# Patient Record
Sex: Female | Born: 1960 | Race: White | Hispanic: No | Marital: Married | Smoking: Former smoker
Health system: Southern US, Community
[De-identification: ages and names within clinical notes are randomized; demographics above are authoritative.]

## PROBLEM LIST (undated history)

## (undated) DIAGNOSIS — J189 Pneumonia, unspecified organism: Secondary | ICD-10-CM

## (undated) DIAGNOSIS — Z8489 Family history of other specified conditions: Secondary | ICD-10-CM

## (undated) DIAGNOSIS — E0789 Other specified disorders of thyroid: Secondary | ICD-10-CM

## (undated) DIAGNOSIS — C50919 Malignant neoplasm of unspecified site of unspecified female breast: Secondary | ICD-10-CM

## (undated) DIAGNOSIS — E119 Type 2 diabetes mellitus without complications: Secondary | ICD-10-CM

## (undated) DIAGNOSIS — Z923 Personal history of irradiation: Secondary | ICD-10-CM

## (undated) DIAGNOSIS — E039 Hypothyroidism, unspecified: Secondary | ICD-10-CM

## (undated) DIAGNOSIS — K219 Gastro-esophageal reflux disease without esophagitis: Secondary | ICD-10-CM

## (undated) HISTORY — PX: JOINT REPLACEMENT: SHX530

## (undated) HISTORY — PX: CATARACT EXTRACTION, BILATERAL: SHX1313

## (undated) HISTORY — PX: COLONOSCOPY: SHX174

---

## 2010-01-17 ENCOUNTER — Ambulatory Visit: Payer: Self-pay | Admitting: Unknown Physician Specialty

## 2010-11-22 ENCOUNTER — Ambulatory Visit: Payer: Self-pay | Admitting: Specialist

## 2010-11-25 LAB — PATHOLOGY REPORT

## 2011-05-05 ENCOUNTER — Emergency Department: Payer: Self-pay | Admitting: *Deleted

## 2011-05-05 LAB — COMPREHENSIVE METABOLIC PANEL
Alkaline Phosphatase: 108 U/L (ref 50–136)
Anion Gap: 12 (ref 7–16)
Bilirubin,Total: 0.3 mg/dL (ref 0.2–1.0)
Calcium, Total: 9.4 mg/dL (ref 8.5–10.1)
Chloride: 105 mmol/L (ref 98–107)
Co2: 24 mmol/L (ref 21–32)
Creatinine: 0.96 mg/dL (ref 0.60–1.30)
Potassium: 3.8 mmol/L (ref 3.5–5.1)
SGOT(AST): 24 U/L (ref 15–37)
SGPT (ALT): 34 U/L
Total Protein: 8 g/dL (ref 6.4–8.2)

## 2011-05-05 LAB — URINALYSIS, COMPLETE
Bilirubin,UR: NEGATIVE
Ketone: NEGATIVE
Ph: 5 (ref 4.5–8.0)
RBC,UR: 5 /HPF (ref 0–5)
Squamous Epithelial: 2
WBC UR: 7 /HPF (ref 0–5)

## 2011-05-05 LAB — LIPASE, BLOOD: Lipase: 161 U/L (ref 73–393)

## 2011-05-05 LAB — CBC
HCT: 42.2 % (ref 35.0–47.0)
HGB: 14.1 g/dL (ref 12.0–16.0)
MCHC: 33.4 g/dL (ref 32.0–36.0)
MCV: 92 fL (ref 80–100)
RBC: 4.58 10*6/uL (ref 3.80–5.20)
RDW: 11.7 % (ref 11.5–14.5)
WBC: 8.7 10*3/uL (ref 3.6–11.0)

## 2014-03-10 DIAGNOSIS — J189 Pneumonia, unspecified organism: Secondary | ICD-10-CM

## 2014-03-10 HISTORY — DX: Pneumonia, unspecified organism: J18.9

## 2014-07-02 NOTE — Consult Note (Signed)
PATIENT NAME:  Denise Saunders, Denise Saunders MR#:  [id number] DATE OF BIRTH:  [birth year]  DATE OF CONSULTATION:  05/05/2011  REFERRING PHYSICIAN:   CONSULTING PHYSICIAN:  Consuela Mimes, MD  HISTORY OF PRESENT ILLNESS: Denise Saunders is a 54 year old white female who developed periumbilical abdominal pain yesterday at 4 p.m. This moved to her right lower quadrant and has persisted. She has not had a fever. She is not nauseous. She does not have any anorexia, vomiting, or diarrhea. She specifically denies all genitourinary symptoms including dysuria, frequency, urgency, and back pain.   PAST MEDICAL HISTORY: No medical illnesses.   PAST SURGICAL HISTORY: Wrist surgery for ganglion cyst only.   MEDICATIONS:  1. Benadryl. 2. Tylenol PM.   ALLERGIES: None.   LABORATORY, DIAGNOSTIC, AND RADIOLOGICAL DATA: Evaluation in the Emergency Room revealed a normal white blood cell count of 8000, normal hematocrit and platelet count. Normal electrolytes. Normal liver tests. Urinalysis showed 1+ blood, 1+ leukocyte esterase, 7 white blood cells per high-power field, and mucous. The patient has not had a period in 13 years. CT scan of the abdomen was essentially normal from a gastrointestinal standpoint including a normal appendix. There was a 41 mm left adnexal cyst and fatty liver as the only abnormal findings. Ultrasound of the pelvis including transvaginal exam confirmed the 44 mm left adnexal cyst with no fluid in the pelvis and no torsion of either ovary. According to the electronic health record, the patient had a normal ultrasound of the gallbladder in 2011 and in November of 2011 had a gallbladder ejection fraction that was normal at 76% as well. The CT scan that was performed was done at 7:30 a.m. which was 15-1/2 hours after the onset of her symptoms.   REVIEW OF SYSTEMS: Negative for 10 systems except as mentioned in the history of present illness. Specifically, the patient denies nausea, vomiting, diarrhea,  fever, and genitourinary symptoms.   FAMILY HISTORY: Noncontributory.   SOCIAL HISTORY: The patient is married and lives at home with her husband and her 17 year old son. She works in Programmer, applications at [employer]. She smokes approximately one-quarter pack of cigarettes per day and does not drink alcohol and has not done so for decades.   PHYSICAL EXAMINATION:   GENERAL: Middle-aged white female with mild to moderate obesity who is comfortable lying in the Emergency Room. Height 5 feet 6 inches, weight 158 pounds, BMI 25.5.   VITAL SIGNS: Temperature 97.6, pulse 115, respirations 20, blood pressure 122/87, oxygen saturation 97% on room air.   HEENT: Pupils equally round and reactive to light. Extraocular movements intact. Sclerae are nonicteric. Oropharynx clear. Mucous membranes moist.   NECK: Supple with no lymphadenopathy, jugular venous distention, or shift in the trachea away from its midline position.   HEART: Regular rate and rhythm with no murmurs or rubs.   LUNGS: Clear to auscultation with normal respiratory effort bilaterally.   ABDOMEN: Soft, nontender, nondistended. The patient does have some voluntary guarding anywhere she is touched at the umbilicus or below but there is no involuntary guarding. If she has any tenderness at all, it is worse in the suprapubic region and the right lower quadrant.   EXTREMITIES: No edema with normal capillary refill bilaterally.   NEUROLOGIC: Cranial nerves II through XII, motor and sensation grossly intact.   PSYCHIATRIC: Alert and oriented x4. Appropriate affect.   ASSESSMENT: Abdominal pain with very low probability of acute appendicitis or gallbladder pathology or other gastrointestinal pathology. Negative CT scan greater than  six hours after onset of symptoms had a very high negative predictive value in this patient with essentially normal physical examination and normal white count and no fever. I do not believe she has acute  appendicitis. She may have a very early urinary tract infection or her left adnexal cyst may be symptomatic even though it does not appear to have ruptured. No surgical admission is indicated and no surgical follow-up is indicated either.   ____________________________ Consuela Mimes, MD wfm:drc D: 05/05/2011 12:32:58 ET T: 05/05/2011 12:44:31 ET JOB#: [id number]  cc: Consuela Mimes, MD, <Dictator> Consuela Mimes MD ELECTRONICALLY SIGNED 05/08/2011 9:45

## 2015-06-15 ENCOUNTER — Ambulatory Visit (INDEPENDENT_AMBULATORY_CARE_PROVIDER_SITE_OTHER): Payer: BC Managed Care – PPO

## 2015-06-15 ENCOUNTER — Ambulatory Visit
Admission: EM | Admit: 2015-06-15 | Discharge: 2015-06-15 | Disposition: A | Discharge status: Home / Self Care | Payer: BC Managed Care – PPO | Attending: Family Medicine | Admitting: Family Medicine

## 2015-06-15 ENCOUNTER — Encounter: Payer: Self-pay | Admitting: *Deleted

## 2015-06-15 DIAGNOSIS — J9801 Acute bronchospasm: Secondary | ICD-10-CM

## 2015-06-15 DIAGNOSIS — J189 Pneumonia, unspecified organism: Secondary | ICD-10-CM | POA: Diagnosis not present

## 2015-06-15 LAB — RAPID STREP SCREEN (MED CTR MEBANE ONLY): Streptococcus, Group A Screen (Direct): NEGATIVE

## 2015-06-15 LAB — RAPID INFLUENZA A&B ANTIGENS (ARMC ONLY)
INFLUENZA A (ARMC): NEGATIVE
INFLUENZA B (ARMC): NEGATIVE

## 2015-06-15 MED ORDER — PREDNISONE 20 MG PO TABS: ORAL_TABLET | ORAL | Status: DC

## 2015-06-15 MED ORDER — IPRATROPIUM-ALBUTEROL 0.5-2.5 (3) MG/3ML IN SOLN
3.0000 mL | Freq: Once | RESPIRATORY_TRACT | Status: AC
  Administered 2015-06-15: 3 mL via RESPIRATORY_TRACT

## 2015-06-15 MED ORDER — HYDROCOD POLST-CPM POLST ER 10-8 MG/5ML PO SUER: 5.0000 mL | Freq: Two times a day (BID) | ORAL | Status: DC | PRN

## 2015-06-15 MED ORDER — ALBUTEROL SULFATE HFA 108 (90 BASE) MCG/ACT IN AERS: 1.0000 | INHALATION_SPRAY | RESPIRATORY_TRACT | Status: DC | PRN

## 2015-06-15 MED ORDER — LEVOFLOXACIN 500 MG PO TABS: 500.0000 mg | ORAL_TABLET | Freq: Every day | ORAL | Status: DC

## 2015-06-15 NOTE — ED Provider Notes (Signed)
CSN: [id number]     Arrival date & time 06/15/15  1300 History   First MD Initiated Contact with Patient 06/15/15 1444     Chief Complaint  Patient presents with  . Cough  . Shortness of Breath  . Generalized Body Aches  . Headache  . Pleurisy   (Consider location/radiation/quality/duration/timing/severity/associated sxs/prior Treatment) Patient is a 55 y.o. female presenting with URI. The history is provided by the patient.  URI Presenting symptoms: congestion, cough, fatigue and fever   Severity:  Moderate Onset quality:  Sudden Duration:  1 week Timing:  Constant Progression:  Worsening Chronicity:  New Relieved by:  Nothing Ineffective treatments:  OTC medications Associated symptoms: headaches and wheezing   Risk factors: not elderly, no chronic cardiac disease, no chronic kidney disease, no immunosuppression, no recent illness and no recent travel  Chronic respiratory disease: unknown; former smoker      History reviewed. No pertinent past medical history. Past Surgical History  Procedure Laterality Date  . Cesarean section     History reviewed. No pertinent family history. Social History  Substance Use Topics  . Smoking status: Former Research scientist (life sciences)  . Smokeless tobacco: None  . Alcohol Use: No   OB History    No data available     Review of Systems  Constitutional: Positive for fever and fatigue.  HENT: Positive for congestion.   Respiratory: Positive for cough and wheezing.   Neurological: Positive for headaches.    Allergies  Review of patient's allergies indicates no known allergies.  Home Medications   Prior to Admission medications   Medication Sig Start Date End Date Taking? Authorizing Provider  albuterol (PROVENTIL HFA;VENTOLIN HFA) 108 (90 Base) MCG/ACT inhaler Inhale 1-2 puffs into the lungs every 4 (four) hours as needed for wheezing or shortness of breath. 06/15/15   Norval Gable, MD  chlorpheniramine-HYDROcodone (TUSSIONEX PENNKINETIC ER) 10-8  MG/5ML SUER Take 5 mLs by mouth every 12 (twelve) hours as needed. 06/15/15   Norval Gable, MD  levofloxacin (LEVAQUIN) 500 MG tablet Take 1 tablet (500 mg total) by mouth daily. 06/15/15   Norval Gable, MD  predniSONE (DELTASONE) 20 MG tablet 3 tabs po once day 1, then 2 tabs po qd for 4 days, then 1 tab po qd for 4 days, then half a tab po qd for 3 days 06/15/15   Norval Gable, MD   Meds Ordered and Administered this Visit   Medications  ipratropium-albuterol (DUONEB) 0.5-2.5 (3) MG/3ML nebulizer solution 3 mL (3 mLs Nebulization Given 06/15/15 1456)  ipratropium-albuterol (DUONEB) 0.5-2.5 (3) MG/3ML nebulizer solution 3 mL (3 mLs Nebulization Given 06/15/15 1532)    BP 104/76 mmHg  Pulse 122  Temp(Src) 99.9 F (37.7 C) (Oral)  Resp 20  Ht 5\' 6"  (1.676 m)  Wt 154 lb (69.854 kg)  BMI 24.87 kg/m2  SpO2 90% No data found.  recheck O2 sat= 92% RA    Physical Exam  Constitutional: She appears well-developed and well-nourished. No distress.  HENT:  Head: Normocephalic and atraumatic.  Right Ear: Tympanic membrane, external ear and ear canal normal.  Left Ear: Tympanic membrane, external ear and ear canal normal.  Nose: No nose lacerations, sinus tenderness, nasal deformity, septal deviation or nasal septal hematoma. No epistaxis.  No foreign bodies.  Mouth/Throat: Uvula is midline, oropharynx is clear and moist and mucous membranes are normal. No oropharyngeal exudate.  Eyes: Conjunctivae and EOM are normal. Pupils are equal, round, and reactive to light. Right eye exhibits no discharge. Left eye exhibits  no discharge. No scleral icterus.  Neck: Normal range of motion. Neck supple. No thyromegaly present.  Cardiovascular: Normal rate, regular rhythm and normal heart sounds.   Pulmonary/Chest: Effort normal. No respiratory distress. She has wheezes (mild to moderate wheezes bilaterally). She has rales (bibasilar right > left).  Lymphadenopathy:    She has no cervical adenopathy.  Skin:  She is not diaphoretic.  Nursing note and vitals reviewed.   ED Course  Procedures (including critical care time)  Labs Review Labs Reviewed  RAPID INFLUENZA A&B ANTIGENS ([facility] ONLY)  RAPID STREP SCREEN (NOT AT [facility])  CULTURE, GROUP A STREP [facility])    Imaging Review Dg Chest 2 View  06/15/2015  CLINICAL DATA:  Cough with fever and chills EXAM: CHEST  2 VIEW COMPARISON:  None. FINDINGS: Bronchitic markings without lobar pneumonia or edema. Especially in the lateral projection there are somewhat streaky areas of reticular nodular density. No effusion or air leak. Normal heart size and mediastinal contours. IMPRESSION: Bronchitis with possible bronchopneumonia. Electronically Signed   By: Monte Fantasia M.D.   On: 06/15/2015 15:15     Visual Acuity Review  Right Eye Distance:   Left Eye Distance:   Bilateral Distance:    Right Eye Near:   Left Eye Near:    Bilateral Near:         MDM   1. Community acquired pneumonia   2. Bronchospasm    New Prescriptions   ALBUTEROL (PROVENTIL HFA;VENTOLIN HFA) 108 (90 BASE) MCG/ACT INHALER    Inhale 1-2 puffs into the lungs every 4 (four) hours as needed for wheezing or shortness of breath.   CHLORPHENIRAMINE-HYDROCODONE (TUSSIONEX PENNKINETIC ER) 10-8 MG/5ML SUER    Take 5 mLs by mouth every 12 (twelve) hours as needed.   LEVOFLOXACIN (LEVAQUIN) 500 MG TABLET    Take 1 tablet (500 mg total) by mouth daily.   PREDNISONE (DELTASONE) 20 MG TABLET    3 tabs po once day 1, then 2 tabs po qd for 4 days, then 1 tab po qd for 4 days, then half a tab po qd for 3 days    1. Labs/x-ray results and diagnosis reviewed with patient 2. Patient given duoneb treatment x 2 with improvement of symptoms and signs; O2 sat=94-95% prior to D/C 3. rx as per orders above; reviewed possible side effects, interactions, risks and benefits  3. Recommend supportive treatment with rest, increased fluids 4. Follow-up up prn; to ED if symptoms worsen   Norval Gable, MD 06/15/15 [phone]

## 2015-06-15 NOTE — ED Notes (Signed)
Productive cough-yellow, dyspnea, body aches, headache, chest pain/soreness with coughing /movement. Recent hx of pneumonia.

## 2015-06-17 LAB — CULTURE, GROUP A STREP (THRC)

## 2016-06-13 ENCOUNTER — Other Ambulatory Visit: Payer: Self-pay | Admitting: Otolaryngology

## 2016-06-13 ENCOUNTER — Other Ambulatory Visit: Payer: Self-pay | Admitting: Medical Genetics

## 2016-06-13 DIAGNOSIS — E041 Nontoxic single thyroid nodule: Secondary | ICD-10-CM

## 2016-06-17 ENCOUNTER — Ambulatory Visit
Admission: RE | Admit: 2016-06-17 | Discharge: 2016-06-17 | Disposition: A | Discharge status: Home / Self Care | Payer: BC Managed Care – PPO | Source: Ambulatory Visit | Attending: Medical Genetics | Admitting: Medical Genetics

## 2016-06-17 DIAGNOSIS — E041 Nontoxic single thyroid nodule: Secondary | ICD-10-CM | POA: Diagnosis present

## 2016-06-19 ENCOUNTER — Other Ambulatory Visit: Payer: Self-pay | Admitting: Otolaryngology

## 2016-06-19 DIAGNOSIS — E041 Nontoxic single thyroid nodule: Secondary | ICD-10-CM

## 2016-06-27 ENCOUNTER — Ambulatory Visit
Admission: RE | Admit: 2016-06-27 | Discharge: 2016-06-27 | Disposition: A | Discharge status: Home / Self Care | Payer: BC Managed Care – PPO | Source: Ambulatory Visit | Attending: Otolaryngology | Admitting: Otolaryngology

## 2016-06-27 ENCOUNTER — Other Ambulatory Visit: Payer: Self-pay | Admitting: Otolaryngology

## 2016-06-27 DIAGNOSIS — E041 Nontoxic single thyroid nodule: Secondary | ICD-10-CM | POA: Insufficient documentation

## 2016-06-27 NOTE — Procedures (Signed)
Pre Procedure Dx: Indeterminate thyroid nodule Post Procedural Dx: Same  Technically successful US guided biopsy of indeterminate 2.1 cm nodule within the right lobe of the thyroid (#4).  Technically successful US guided biopsy of indeterminate 1.8 cm nodule within the left lobe of the thyroid (#2).  EBL: Trace  No immediate complications.   Ronny Bacon, MD Pager #: [phone]

## 2016-06-27 NOTE — Discharge Instructions (Signed)
Thyroid Biopsy, Care After Refer to this sheet in the next few weeks. These instructions provide you with information on caring for yourself after your procedure. Your health care provider may also give you more specific instructions. Your treatment has been planned according to current medical practices, but problems sometimes occur. Call your health care provider if you have any problems or questions after your procedure. What can I expect after the procedure? After your procedure, it is typical to have the following:  You may have soreness and tenderness at the biopsy site for a few days.  You are encouraged to use ice pack to neck hourly for about 5 minutes. Any discomfort should go away after a couple days. Follow these instructions at home:  Take medicines only as directed by your health care provider.  To ease discomfort at the biopsy site:  Keep your head raised on a pillow when you are lying down.  Support the back of your head and neck with both hands as you sit up from a lying position.  If you have a sore throat, try using throat lozenges or gargling with warm salt water.  Keep all follow-up visits as directed by your health care provider. This is important. Contact a health care provider if:  You have a fever. Get help right away if:  You have severe bleeding from the biopsy site.  You have difficulty swallowing.  You have drainage, redness, swelling, or pain at the biopsy site.  You have swollen glands (lymph nodes) in your neck. This information is not intended to replace advice given to you by your health care provider. Make sure you discuss any questions you have with your health care provider. Document Released: 09/21/2013 Document Revised: 10/28/2015 Document Reviewed: 05/19/2013 Elsevier Interactive Patient Education  2017 [employer].

## 2016-07-01 LAB — CYTOLOGY - NON PAP

## 2016-07-08 ENCOUNTER — Other Ambulatory Visit: Payer: Self-pay | Admitting: Otolaryngology

## 2016-07-08 DIAGNOSIS — E041 Nontoxic single thyroid nodule: Secondary | ICD-10-CM

## 2016-07-08 DIAGNOSIS — E0590 Thyrotoxicosis, unspecified without thyrotoxic crisis or storm: Secondary | ICD-10-CM

## 2016-08-07 ENCOUNTER — Encounter
Admission: RE | Admit: 2016-08-07 | Discharge: 2016-08-07 | Disposition: A | Discharge status: Home / Self Care | Payer: BC Managed Care – PPO | Source: Ambulatory Visit | Attending: Otolaryngology | Admitting: Otolaryngology

## 2016-08-07 DIAGNOSIS — E0520 Thyrotoxicosis with toxic multinodular goiter without thyrotoxic crisis or storm: Secondary | ICD-10-CM | POA: Insufficient documentation

## 2016-08-07 DIAGNOSIS — E0590 Thyrotoxicosis, unspecified without thyrotoxic crisis or storm: Secondary | ICD-10-CM

## 2016-08-07 MED ORDER — SODIUM IODIDE I-123 7.4 MBQ CAPS
158.5000 | ORAL_CAPSULE | Freq: Once | ORAL | Status: AC
  Administered 2016-08-07: 158.5 via ORAL

## 2016-08-08 ENCOUNTER — Encounter
Admission: RE | Admit: 2016-08-08 | Discharge: 2016-08-08 | Disposition: A | Discharge status: Home / Self Care | Payer: BC Managed Care – PPO | Source: Ambulatory Visit | Attending: Otolaryngology | Admitting: Otolaryngology

## 2016-09-15 ENCOUNTER — Encounter
Admission: RE | Admit: 2016-09-15 | Discharge: 2016-09-15 | Disposition: A | Discharge status: Home / Self Care | Payer: BC Managed Care – PPO | Source: Ambulatory Visit | Attending: Otolaryngology | Admitting: Otolaryngology

## 2016-09-15 DIAGNOSIS — Z01818 Encounter for other preprocedural examination: Secondary | ICD-10-CM | POA: Insufficient documentation

## 2016-09-15 DIAGNOSIS — E119 Type 2 diabetes mellitus without complications: Secondary | ICD-10-CM | POA: Insufficient documentation

## 2016-09-15 HISTORY — DX: Pneumonia, unspecified organism: J18.9

## 2016-09-15 HISTORY — DX: Gastro-esophageal reflux disease without esophagitis: K21.9

## 2016-09-15 HISTORY — DX: Type 2 diabetes mellitus without complications: E11.9

## 2016-09-15 HISTORY — DX: Family history of other specified conditions: Z84.89

## 2016-09-15 NOTE — Pre-Procedure Instructions (Signed)
SPOKE WITH DR Ronelle Nigh REGARDING HGB A1C OF 10.2 BACK IN MAY 2018.  PT HAVING THYROIDECTOMY ON 09-22-16. PT COMING IN FOR RECHECK OF METB PER DR JUENGELS ORDER.  DR Ronelle Nigh INFORMED THAT PT WAS STARTED ON GLIPIZIDE BACK IN MAY TO HELP TO BETTER CONTROL DIABETES.  DR Ronelle Nigh DID NOT WANT CLEARANCE SINCE NEW ORAL MED WAS STARTED RECENTLY BY PCP (PT DOES NOT SEE ENDOCRINOLOGIST).

## 2016-09-15 NOTE — Patient Instructions (Signed)
  Your procedure is scheduled on: 09-22-16 MONDAY Report to Same Day Surgery 2nd floor medical mall [facility] Entrance-take elevator on left to 2nd floor.  Check in with surgery information desk.) To find out your arrival time please call [phone] between 1PM - 3PM on 09-19-16 FRIDAY  Remember: Instructions that are not followed completely may result in serious medical risk, up to and including death, or upon the discretion of your surgeon and anesthesiologist your surgery may need to be rescheduled.    _x___ 1. Do not eat food or drink liquids after midnight. No gum chewing or hard candies.     __x__ 2. No Alcohol for 24 hours before or after surgery.   __x__3. No Smoking for 24 prior to surgery.   ____  4. Bring all medications with you on the day of surgery if instructed.    __x__ 5. Notify your doctor if there is any change in your medical condition     (cold, fever, infections).     Do not wear jewelry, make-up, hairpins, clips or nail polish.  Do not wear lotions, powders, or perfumes. You may wear deodorant.  Do not shave 48 hours prior to surgery. Men may shave face and neck.  Do not bring valuables to the hospital.    [facility] is not responsible for any belongings or valuables.               Contacts, dentures or bridgework may not be worn into surgery.  Leave your suitcase in the car. After surgery it may be brought to your room.  For patients admitted to the hospital, discharge time is determined by your treatment team.   Patients discharged the day of surgery will not be allowed to drive home.  You will need someone to drive you home and stay with you the night of your procedure.    Please read over the following fact sheets that you were given:     _x___ [place] WITH A SMALL SIP OF WATER. These include:  1. PRILOSEC  2. TAKE A PRILOSEC Sunday NIGHT BEFORE BED  3.  4.  5.  6.  ____Fleets enema or Magnesium  Citrate as directed.   _x___ Use CHG Soap or sage wipes as directed on instruction sheet   ____ Use inhalers on the day of surgery and bring to hospital day of surgery  _X___ Stop Metformin and Janumet 2 days prior to surgery-LAST [place], July 13TH    ____ Take 1/2 of usual insulin dose the night before surgery and none on the morning surgery.   ____ Follow recommendations from Cardiologist, Pulmonologist or PCP regarding  stopping Aspirin, Coumadin, Pllavix ,Eliquis, Effient, or Pradaxa, and Pletal.  X____Stop Anti-inflammatories such as Advil, Aleve, Ibuprofen, Motrin, Naproxen, Naprosyn, Goodies powders or aspirin products NOW-OK to take Tylenol    ____ Stop supplements until after surgery.     ____ Bring C-Pap to the hospital.

## 2016-09-17 ENCOUNTER — Encounter
Admission: RE | Admit: 2016-09-17 | Discharge: 2016-09-17 | Disposition: A | Discharge status: Home / Self Care | Payer: BC Managed Care – PPO | Source: Ambulatory Visit | Attending: Otolaryngology | Admitting: Otolaryngology

## 2016-09-17 DIAGNOSIS — Z01818 Encounter for other preprocedural examination: Secondary | ICD-10-CM | POA: Diagnosis present

## 2016-09-17 DIAGNOSIS — E119 Type 2 diabetes mellitus without complications: Secondary | ICD-10-CM | POA: Diagnosis not present

## 2016-09-17 LAB — BASIC METABOLIC PANEL
ANION GAP: 11 (ref 5–15)
BUN: 14 mg/dL (ref 6–20)
CALCIUM: 9.6 mg/dL (ref 8.9–10.3)
CO2: 28 mmol/L (ref 22–32)
CREATININE: 0.7 mg/dL (ref 0.44–1.00)
Chloride: 102 mmol/L (ref 101–111)
Glucose, Bld: 179 mg/dL — ABNORMAL HIGH (ref 65–99)
Potassium: 4.4 mmol/L (ref 3.5–5.1)
SODIUM: 141 mmol/L (ref 135–145)

## 2016-09-17 LAB — HEPATIC FUNCTION PANEL
ALT: 27 U/L (ref 14–54)
AST: 19 U/L (ref 15–41)
Albumin: 4.3 g/dL (ref 3.5–5.0)
Alkaline Phosphatase: 113 U/L (ref 38–126)
BILIRUBIN TOTAL: 1.1 mg/dL (ref 0.3–1.2)
Total Protein: 7.4 g/dL (ref 6.5–8.1)

## 2016-09-18 NOTE — Pre-Procedure Instructions (Signed)
CALLED DR Marcello Moores REGARDING EKG THAT SHOWED SINUS TACH AT 107-DR Hospers IS FINE AND OK TO PROCEED WITH SURGERY

## 2016-09-22 ENCOUNTER — Encounter
Admission: RE | Disposition: A | Discharge status: Home / Self Care | Payer: Self-pay | Source: Ambulatory Visit | Attending: Otolaryngology

## 2016-09-22 ENCOUNTER — Observation Stay
Admission: RE | Admit: 2016-09-22 | Discharge: 2016-09-23 | Disposition: A | Discharge status: Home / Self Care | Payer: BC Managed Care – PPO | Source: Ambulatory Visit | Attending: Otolaryngology | Admitting: Otolaryngology

## 2016-09-22 ENCOUNTER — Encounter: Payer: Self-pay | Admitting: *Deleted

## 2016-09-22 ENCOUNTER — Ambulatory Visit: Payer: BC Managed Care – PPO | Admitting: Anesthesiology

## 2016-09-22 DIAGNOSIS — E0789 Other specified disorders of thyroid: Secondary | ICD-10-CM | POA: Diagnosis present

## 2016-09-22 DIAGNOSIS — Z79899 Other long term (current) drug therapy: Secondary | ICD-10-CM | POA: Insufficient documentation

## 2016-09-22 DIAGNOSIS — E042 Nontoxic multinodular goiter: Secondary | ICD-10-CM | POA: Diagnosis present

## 2016-09-22 DIAGNOSIS — E119 Type 2 diabetes mellitus without complications: Secondary | ICD-10-CM | POA: Diagnosis not present

## 2016-09-22 DIAGNOSIS — K219 Gastro-esophageal reflux disease without esophagitis: Secondary | ICD-10-CM | POA: Diagnosis not present

## 2016-09-22 DIAGNOSIS — Z7984 Long term (current) use of oral hypoglycemic drugs: Secondary | ICD-10-CM | POA: Insufficient documentation

## 2016-09-22 DIAGNOSIS — E0520 Thyrotoxicosis with toxic multinodular goiter without thyrotoxic crisis or storm: Secondary | ICD-10-CM | POA: Diagnosis not present

## 2016-09-22 DIAGNOSIS — F17200 Nicotine dependence, unspecified, uncomplicated: Secondary | ICD-10-CM | POA: Insufficient documentation

## 2016-09-22 HISTORY — PX: THYROIDECTOMY: SHX17

## 2016-09-22 LAB — GLUCOSE, CAPILLARY
GLUCOSE-CAPILLARY: 240 mg/dL — AB (ref 65–99)
GLUCOSE-CAPILLARY: 383 mg/dL — AB (ref 65–99)
Glucose-Capillary: 222 mg/dL — ABNORMAL HIGH (ref 65–99)
Glucose-Capillary: 228 mg/dL — ABNORMAL HIGH (ref 65–99)

## 2016-09-22 SURGERY — THYROIDECTOMY
Anesthesia: General | Wound class: Clean

## 2016-09-22 MED ORDER — ACETAMINOPHEN 10 MG/ML IV SOLN
INTRAVENOUS | Status: AC
  Filled 2016-09-22: qty 100

## 2016-09-22 MED ORDER — FENTANYL CITRATE (PF) 100 MCG/2ML IJ SOLN
INTRAMUSCULAR | Status: AC
  Filled 2016-09-22: qty 2

## 2016-09-22 MED ORDER — LIDOCAINE HCL (PF) 2 % IJ SOLN
INTRAMUSCULAR | Status: AC
  Filled 2016-09-22: qty 2

## 2016-09-22 MED ORDER — ACETAMINOPHEN 325 MG PO TABS: 650.0000 mg | ORAL_TABLET | Freq: Four times a day (QID) | ORAL | Status: DC | PRN

## 2016-09-22 MED ORDER — INSULIN ASPART 100 UNIT/ML ~~LOC~~ SOLN
3.0000 [IU] | Freq: Once | SUBCUTANEOUS | Status: AC
  Administered 2016-09-22: 3 [IU] via SUBCUTANEOUS

## 2016-09-22 MED ORDER — ONDANSETRON HCL 4 MG/2ML IJ SOLN: 4.0000 mg | Freq: Once | INTRAMUSCULAR | Status: DC | PRN

## 2016-09-22 MED ORDER — LIDOCAINE HCL (CARDIAC) 20 MG/ML IV SOLN
INTRAVENOUS | Status: DC | PRN
  Administered 2016-09-22: 100 mg via INTRAVENOUS

## 2016-09-22 MED ORDER — ACETAMINOPHEN 650 MG RE SUPP: 650.0000 mg | Freq: Four times a day (QID) | RECTAL | Status: DC | PRN

## 2016-09-22 MED ORDER — PANTOPRAZOLE SODIUM 40 MG PO TBEC: 40.0000 mg | DELAYED_RELEASE_TABLET | Freq: Every day | ORAL | Status: DC | PRN

## 2016-09-22 MED ORDER — PROPOFOL 10 MG/ML IV BOLUS
INTRAVENOUS | Status: AC
  Filled 2016-09-22: qty 40

## 2016-09-22 MED ORDER — DEXAMETHASONE SODIUM PHOSPHATE 10 MG/ML IJ SOLN
INTRAMUSCULAR | Status: DC | PRN
  Administered 2016-09-22: 10 mg via INTRAVENOUS

## 2016-09-22 MED ORDER — INSULIN ASPART 100 UNIT/ML ~~LOC~~ SOLN
SUBCUTANEOUS | Status: AC
  Administered 2016-09-22: 3 [IU] via SUBCUTANEOUS
  Filled 2016-09-22: qty 1

## 2016-09-22 MED ORDER — PHENYLEPHRINE HCL 10 MG/ML IJ SOLN
INTRAMUSCULAR | Status: AC
  Filled 2016-09-22: qty 1

## 2016-09-22 MED ORDER — GLYCOPYRROLATE 0.2 MG/ML IJ SOLN
INTRAMUSCULAR | Status: DC | PRN
  Administered 2016-09-22: 0.2 mg via INTRAVENOUS

## 2016-09-22 MED ORDER — INSULIN ASPART 100 UNIT/ML ~~LOC~~ SOLN
0.0000 [IU] | Freq: Every day | SUBCUTANEOUS | Status: DC
  Administered 2016-09-22: 2 [IU] via SUBCUTANEOUS
  Filled 2016-09-22: qty 1

## 2016-09-22 MED ORDER — PHENYLEPHRINE HCL 10 MG/ML IJ SOLN
INTRAMUSCULAR | Status: DC | PRN
  Administered 2016-09-22: 100 ug via INTRAVENOUS
  Administered 2016-09-22: 200 ug via INTRAVENOUS
  Administered 2016-09-22 (×2): 100 ug via INTRAVENOUS
  Administered 2016-09-22: 200 ug via INTRAVENOUS

## 2016-09-22 MED ORDER — SODIUM CHLORIDE 0.9 % IV SOLN
INTRAVENOUS | Status: DC
  Administered 2016-09-22 (×2): via INTRAVENOUS

## 2016-09-22 MED ORDER — FLEET ENEMA 7-19 GM/118ML RE ENEM: 1.0000 | ENEMA | Freq: Once | RECTAL | Status: DC | PRN

## 2016-09-22 MED ORDER — SEVOFLURANE IN SOLN
RESPIRATORY_TRACT | Status: AC
  Filled 2016-09-22: qty 250

## 2016-09-22 MED ORDER — METFORMIN HCL 500 MG PO TABS
1000.0000 mg | ORAL_TABLET | Freq: Two times a day (BID) | ORAL | Status: DC
  Administered 2016-09-22: 1000 mg via ORAL
  Filled 2016-09-22 (×3): qty 2

## 2016-09-22 MED ORDER — SODIUM CHLORIDE 0.9 % IV SOLN
INTRAVENOUS | Status: DC | PRN
  Administered 2016-09-22: 60 ug/min via INTRAVENOUS

## 2016-09-22 MED ORDER — GLIPIZIDE 10 MG PO TABS
10.0000 mg | ORAL_TABLET | Freq: Every evening | ORAL | Status: DC
  Administered 2016-09-22: 10 mg via ORAL
  Filled 2016-09-22: qty 1

## 2016-09-22 MED ORDER — GLYCOPYRROLATE 0.2 MG/ML IJ SOLN
INTRAMUSCULAR | Status: AC
  Filled 2016-09-22: qty 1

## 2016-09-22 MED ORDER — ONDANSETRON HCL 4 MG/2ML IJ SOLN
INTRAMUSCULAR | Status: AC
  Filled 2016-09-22: qty 2

## 2016-09-22 MED ORDER — INSULIN ASPART 100 UNIT/ML ~~LOC~~ SOLN: 0.0000 [IU] | Freq: Three times a day (TID) | SUBCUTANEOUS | Status: DC

## 2016-09-22 MED ORDER — BACITRACIN ZINC 500 UNIT/GM EX OINT
TOPICAL_OINTMENT | CUTANEOUS | Status: AC
  Filled 2016-09-22: qty 28.35

## 2016-09-22 MED ORDER — LIDOCAINE-EPINEPHRINE (PF) 1 %-1:200000 IJ SOLN
INTRAMUSCULAR | Status: DC | PRN
  Administered 2016-09-22: 5 mL

## 2016-09-22 MED ORDER — DEXTROSE-NACL 5-0.45 % IV SOLN
INTRAVENOUS | Status: DC
  Administered 2016-09-22 – 2016-09-23 (×2): via INTRAVENOUS

## 2016-09-22 MED ORDER — SUCCINYLCHOLINE CHLORIDE 20 MG/ML IJ SOLN
INTRAMUSCULAR | Status: DC | PRN
  Administered 2016-09-22: 100 mg via INTRAVENOUS

## 2016-09-22 MED ORDER — FENTANYL CITRATE (PF) 100 MCG/2ML IJ SOLN
25.0000 ug | INTRAMUSCULAR | Status: DC | PRN
  Administered 2016-09-22 (×4): 25 ug via INTRAVENOUS

## 2016-09-22 MED ORDER — BISACODYL 5 MG PO TBEC: 5.0000 mg | DELAYED_RELEASE_TABLET | Freq: Every day | ORAL | Status: DC | PRN

## 2016-09-22 MED ORDER — ONDANSETRON 4 MG PO TBDP: 4.0000 mg | ORAL_TABLET | Freq: Four times a day (QID) | ORAL | Status: DC | PRN

## 2016-09-22 MED ORDER — METOPROLOL TARTRATE 5 MG/5ML IV SOLN
INTRAVENOUS | Status: DC | PRN
  Administered 2016-09-22: 2 mg via INTRAVENOUS
  Administered 2016-09-22: 1 mg via INTRAVENOUS

## 2016-09-22 MED ORDER — MORPHINE SULFATE (PF) 4 MG/ML IV SOLN
3.0000 mg | INTRAVENOUS | Status: DC | PRN
  Administered 2016-09-22 (×2): 3 mg via INTRAVENOUS
  Filled 2016-09-22 (×2): qty 1

## 2016-09-22 MED ORDER — DEXAMETHASONE SODIUM PHOSPHATE 10 MG/ML IJ SOLN
INTRAMUSCULAR | Status: AC
  Filled 2016-09-22: qty 1

## 2016-09-22 MED ORDER — EPHEDRINE SULFATE 50 MG/ML IJ SOLN
INTRAMUSCULAR | Status: AC
  Filled 2016-09-22: qty 1

## 2016-09-22 MED ORDER — MAGNESIUM HYDROXIDE 400 MG/5ML PO SUSP
30.0000 mL | Freq: Every day | ORAL | Status: DC | PRN
  Filled 2016-09-22: qty 30

## 2016-09-22 MED ORDER — HYDROCODONE-ACETAMINOPHEN 5-325 MG PO TABS
1.0000 | ORAL_TABLET | ORAL | Status: DC | PRN
  Administered 2016-09-22: 2 via ORAL
  Administered 2016-09-22 – 2016-09-23 (×2): 1 via ORAL
  Filled 2016-09-22: qty 2
  Filled 2016-09-22 (×2): qty 1

## 2016-09-22 MED ORDER — GABAPENTIN 300 MG PO CAPS
300.0000 mg | ORAL_CAPSULE | Freq: Two times a day (BID) | ORAL | Status: DC
  Filled 2016-09-22: qty 1

## 2016-09-22 MED ORDER — VENLAFAXINE HCL ER 75 MG PO CP24
150.0000 mg | ORAL_CAPSULE | Freq: Every day | ORAL | Status: DC
  Filled 2016-09-22: qty 2

## 2016-09-22 MED ORDER — FENTANYL CITRATE (PF) 100 MCG/2ML IJ SOLN
INTRAMUSCULAR | Status: DC | PRN
  Administered 2016-09-22 (×4): 25 ug via INTRAVENOUS
  Administered 2016-09-22: 100 ug via INTRAVENOUS

## 2016-09-22 MED ORDER — ONDANSETRON HCL 4 MG/2ML IJ SOLN: 4.0000 mg | Freq: Four times a day (QID) | INTRAMUSCULAR | Status: DC | PRN

## 2016-09-22 MED ORDER — DIPHENHYDRAMINE HCL 25 MG PO CAPS
25.0000 mg | ORAL_CAPSULE | ORAL | Status: DC | PRN
  Administered 2016-09-22 – 2016-09-23 (×2): 25 mg via ORAL
  Filled 2016-09-22 (×2): qty 1

## 2016-09-22 MED ORDER — ONDANSETRON HCL 4 MG/2ML IJ SOLN
INTRAMUSCULAR | Status: DC | PRN
  Administered 2016-09-22: 4 mg via INTRAVENOUS

## 2016-09-22 MED ORDER — OMEPRAZOLE MAGNESIUM 20 MG PO TBEC: 20.0000 mg | DELAYED_RELEASE_TABLET | ORAL | Status: DC | PRN

## 2016-09-22 MED ORDER — PROPOFOL 10 MG/ML IV BOLUS
INTRAVENOUS | Status: DC | PRN
  Administered 2016-09-22: 150 mg via INTRAVENOUS
  Administered 2016-09-22: 100 mg via INTRAVENOUS

## 2016-09-22 MED ORDER — METOPROLOL TARTRATE 5 MG/5ML IV SOLN
3.0000 mg | Freq: Once | INTRAVENOUS | Status: AC
  Administered 2016-09-22: 3 mg via INTRAVENOUS

## 2016-09-22 MED ORDER — METOPROLOL TARTRATE 5 MG/5ML IV SOLN
INTRAVENOUS | Status: AC
  Filled 2016-09-22: qty 5

## 2016-09-22 MED ORDER — METOPROLOL TARTRATE 5 MG/5ML IV SOLN
INTRAVENOUS | Status: AC
  Administered 2016-09-22: 3 mg via INTRAVENOUS
  Filled 2016-09-22: qty 5

## 2016-09-22 MED ORDER — FENTANYL CITRATE (PF) 100 MCG/2ML IJ SOLN
INTRAMUSCULAR | Status: AC
  Administered 2016-09-22: 25 ug via INTRAVENOUS
  Filled 2016-09-22: qty 2

## 2016-09-22 MED ORDER — MIDAZOLAM HCL 2 MG/2ML IJ SOLN
INTRAMUSCULAR | Status: DC | PRN
  Administered 2016-09-22: 2 mg via INTRAVENOUS

## 2016-09-22 MED ORDER — SUCCINYLCHOLINE CHLORIDE 20 MG/ML IJ SOLN
INTRAMUSCULAR | Status: AC
  Filled 2016-09-22: qty 1

## 2016-09-22 MED ORDER — LIDOCAINE-EPINEPHRINE (PF) 1 %-1:200000 IJ SOLN
INTRAMUSCULAR | Status: AC
  Filled 2016-09-22: qty 30

## 2016-09-22 MED ORDER — MIDAZOLAM HCL 2 MG/2ML IJ SOLN
INTRAMUSCULAR | Status: AC
  Filled 2016-09-22: qty 2

## 2016-09-22 MED ORDER — ACETAMINOPHEN 10 MG/ML IV SOLN
INTRAVENOUS | Status: DC | PRN
  Administered 2016-09-22: 1000 mg via INTRAVENOUS

## 2016-09-22 SURGICAL SUPPLY — 39 items
BLADE SURG 15 STRL LF DISP TIS (BLADE) ×1 IMPLANT
BLADE SURG 15 STRL SS (BLADE) ×2
CANISTER SUCT 1200ML W/VALVE (MISCELLANEOUS) ×3 IMPLANT
CORD BIP STRL DISP 12FT (MISCELLANEOUS) ×3 IMPLANT
DRAIN TLS ROUND 10FR (DRAIN) IMPLANT
DRAPE MAG INST 16X20 L/F (DRAPES) ×3 IMPLANT
DRSG TEGADERM 2-3/8X2-3/4 SM (GAUZE/BANDAGES/DRESSINGS) ×3 IMPLANT
DRSG TEGADERM 4X4.75 (GAUZE/BANDAGES/DRESSINGS) ×3 IMPLANT
DRSG TELFA 3X8 NADH (GAUZE/BANDAGES/DRESSINGS) IMPLANT
ELECT LARYNGEAL 6/7 (MISCELLANEOUS) ×3
ELECT LARYNGEAL 8/9 (MISCELLANEOUS)
ELECT REM PT RETURN 9FT ADLT (ELECTROSURGICAL) ×3
ELECTRODE LARYNGEAL 6/7 (MISCELLANEOUS) ×1 IMPLANT
ELECTRODE LARYNGEAL 8/9 (MISCELLANEOUS) IMPLANT
ELECTRODE REM PT RTRN 9FT ADLT (ELECTROSURGICAL) ×1 IMPLANT
FORCEPS JEWEL BIP 4-3/4 STR (INSTRUMENTS) ×3 IMPLANT
GLOVE BIO SURGEON STRL SZ7.5 (GLOVE) ×6 IMPLANT
GLOVE BIOGEL PI IND STRL 6.5 (GLOVE) ×1 IMPLANT
GLOVE BIOGEL PI INDICATOR 6.5 (GLOVE) ×2
GLOVE PROTEXIS LATEX SZ 7.5 (GLOVE) ×3 IMPLANT
GOWN STRL REUS W/ TWL LRG LVL3 (GOWN DISPOSABLE) ×3 IMPLANT
GOWN STRL REUS W/TWL LRG LVL3 (GOWN DISPOSABLE) ×6
HEMOSTAT SURGICEL 2X3 (HEMOSTASIS) ×3 IMPLANT
HOOK STAY BLUNT/RETRACTOR 5M (MISCELLANEOUS) ×3 IMPLANT
LABEL OR SOLS (LABEL) ×3 IMPLANT
NS IRRIG 500ML POUR BTL (IV SOLUTION) ×3 IMPLANT
PACK HEAD/NECK (MISCELLANEOUS) ×3 IMPLANT
PROBE NEUROSIGN BIPOL (MISCELLANEOUS) ×1 IMPLANT
PROBE NEUROSIGN BIPOLAR (MISCELLANEOUS) ×2
SHEARS HARMONIC 9CM CVD (BLADE) ×3 IMPLANT
SPONGE KITTNER 5P (MISCELLANEOUS) ×3 IMPLANT
SPONGE XRAY 4X4 16PLY STRL (MISCELLANEOUS) ×6 IMPLANT
STRAP SAFETY BODY (MISCELLANEOUS) ×3 IMPLANT
SUT ETHILON 6 0 9-3 1X18 BLK (SUTURE) IMPLANT
SUT PROLENE 6 0 P 1 18 (SUTURE) ×3 IMPLANT
SUT SILK 2 0 (SUTURE) ×2
SUT SILK 2-0 18XBRD TIE 12 (SUTURE) ×1 IMPLANT
SUT VIC AB 4-0 RB1 18 (SUTURE) ×3 IMPLANT
SYSTEM CHEST DRAIN TLS 7FR (DRAIN) ×6 IMPLANT

## 2016-09-22 NOTE — Anesthesia Post-op Follow-up Note (Cosign Needed)
Anesthesia QCDR form completed.        

## 2016-09-22 NOTE — Anesthesia Preprocedure Evaluation (Signed)
Anesthesia Evaluation  Patient identified by MRN, date of birth, ID band Patient awake    Reviewed: Allergy & Precautions, H&P , NPO status , Patient's Chart, lab work & pertinent test results, reviewed documented beta blocker date and time   Airway Mallampati: II  TM Distance: >3 FB Neck ROM: full    Dental  (+) Teeth Intact   Pulmonary neg pulmonary ROS, pneumonia, resolved, Current Smoker,    Pulmonary exam normal        Cardiovascular Exercise Tolerance: Good negative cardio ROS Normal cardiovascular exam Rhythm:regular Rate:Normal     Neuro/Psych negative neurological ROS  negative psych ROS   GI/Hepatic negative GI ROS, Neg liver ROS, GERD  Medicated,  Endo/Other  negative endocrine ROSdiabetes, Poorly Controlled, Type 2, Oral Hypoglycemic Agents  Renal/GU negative Renal ROS  negative genitourinary   Musculoskeletal   Abdominal   Peds  Hematology negative hematology ROS (+)   Anesthesia Other Findings Past Medical History: No date: Diabetes mellitus without complication (HCC) No date: Family history of adverse reaction to anesthesia     Comment:  MOM-N/V AND IS HARD TO WAKE No date: GERD (gastroesophageal reflux disease)     Comment:  [place] 2016: Pneumonia Past Surgical History: No date: CESAREAN SECTION   Reproductive/Obstetrics negative OB ROS                             Anesthesia Physical Anesthesia Plan  ASA: III  Anesthesia Plan: General ETT   Post-op Pain Management:    Induction:   PONV Risk Score and Plan: 3 and Ondansetron, Dexamethasone, Propofol and Midazolam  Airway Management Planned:   Additional Equipment:   Intra-op Plan:   Post-operative Plan:   Informed Consent: I have reviewed the patients History and Physical, chart, labs and discussed the procedure including the risks, benefits and alternatives for the proposed anesthesia with the patient or  authorized representative who has indicated his/her understanding and acceptance.   Dental Advisory Given  Plan Discussed with: CRNA  Anesthesia Plan Comments:         Anesthesia Quick Evaluation

## 2016-09-22 NOTE — OR Nursing (Signed)
Two 7 french TLS drains placed in neck. Right lateral neck and left lateral neck.

## 2016-09-22 NOTE — Progress Notes (Signed)
Denise Saunders 09/22/2016 6:30 PM  S: Patient is feeling quite well and is eating okay. She is had a little bit of a headache possibly from the morphine. She is little scratching her throat but is not complaining of other severe pain.  O: She is afebrile. Her vital signs still show her pulse rate to be a little bit elevated. Her blood pressure is relatively low. Her neck wound is flat and there is no blood staining on the dressing at all. Her drains are intact and changed recently. She does not have any signs of hypocalcemia.  A: She is doing very well postoperatively and is very stable. We'll plan to use Vicodin for pain orally. We'll continue to change drain Vacutainer's every 4 hours.  P: Lab work scheduled for early a.m. to get a calcium blood level. We will plan to change dressings and remove drains in the morning if the wound continues look good. We'll plan to discharge home in the morning if she continues to do well. We'll check fingersticks to make sure her blood sugar is not elevated.

## 2016-09-22 NOTE — Progress Notes (Signed)
Patient's BG elevated.  Patient had initially refused metformin and glipizide however was agreeable to take it at this time.  Administered and entered orders for SS coverage per MD order.

## 2016-09-22 NOTE — Anesthesia Postprocedure Evaluation (Signed)
Anesthesia Post Note  Patient: Denise Saunders  Procedure(s) Performed: Procedure(s) (LRB): THYROIDECTOMY (N/A)  Patient location during evaluation: PACU Anesthesia Type: General Level of consciousness: awake and alert Pain management: pain level controlled Vital Signs Assessment: post-procedure vital signs reviewed and stable Respiratory status: spontaneous breathing, nonlabored ventilation, respiratory function stable and patient connected to nasal cannula oxygen Cardiovascular status: blood pressure returned to baseline and stable Postop Assessment: no signs of nausea or vomiting Anesthetic complications: no     Last Vitals:  Vitals:   09/22/16 1153 09/22/16 1213  BP: 104/71 116/70  Pulse: (!) 101 98  Resp: 14 16  Temp:  36.8 C    Last Pain:  Vitals:   09/22/16 1227  TempSrc:   PainSc: [place]

## 2016-09-22 NOTE — H&P (Signed)
H&P has been reviewedand patient reevaluated,  and no changes necessary. To be downloaded later.  

## 2016-09-22 NOTE — Transfer of Care (Signed)
Immediate Anesthesia Transfer of Care Note  Patient: Denise Saunders  Procedure(s) Performed: Procedure(s): THYROIDECTOMY (N/A)  Patient Location: PACU  Anesthesia Type:General  Level of Consciousness: sedated  Airway & Oxygen Therapy: Patient Spontanous Breathing and Patient connected to face mask oxygen  Post-op Assessment: Report given to RN and Post -op Vital signs reviewed and stable  Post vital signs: Reviewed and stable  Last Vitals:  Vitals:   09/22/16 0616 09/22/16 1023  BP: 114/76 111/67  Pulse: (!) 112 (!) 103  Resp: 18 14  Temp: 37.5 C 69.4 C    Complications: No apparent anesthesia complications

## 2016-09-22 NOTE — Progress Notes (Signed)
Inpatient Diabetes Program Recommendations  AACE/ADA: New Consensus Statement on Inpatient Glycemic Control (2015)  Target Ranges:  Prepandial:   less than 140 mg/dL      Peak postprandial:   less than 180 mg/dL (1-2 hours)      Critically ill patients:  140 - 180 mg/dL  Results for Denise Saunders, Denise Saunders (MRN [id number]) as of 09/22/2016 12:16  Ref. Range 09/22/2016 06:17 09/22/2016 10:27  Glucose-Capillary Latest Ref Range: 65 - 99 mg/dL 222 (H) 240 (H)    Review of Glycemic Control  Diabetes history: DM2 Outpatient Diabetes medications: Glipizide 10 mg QPM, Metformin 1000 mg BID Current orders for Inpatient glycemic control: Glipizide 10 mg QPM, Metformin 1000 mg BID   Inpatient Diabetes Program Recommendations: Correction (SSI): While inpatient, please consider ordering CBGs with Novolog correction scale ACHS.  Thanks, Barnie Alderman, RN, MSN, CDE Diabetes Coordinator Inpatient Diabetes Program [phone] (Team Pager from 8am to 5pm)

## 2016-09-22 NOTE — Op Note (Signed)
09/22/2016  10:24 AM    Denise Saunders  [id number]   Pre-Op Dx:  Hyperthyroidism, multinodular goiter with hot nodules  Post-op Dx: Same  Proc: Total thyroidectomy   Surg:  Huey Romans   assistant: Carloyn Manner  Anes:  GOT  EBL:  100 mL  Comp:  None  Findings:  Very large multinodular goiter with edema. Both recurrent laryngeal nerves were found and stimulated well at the end of the case. Both parathyroids are found on the left side but were not found on the right side.  Procedure: The patient was brought to the operating room and placed in supine position. General anesthesia was obtained by oral endotracheal intubation. An electrode was placed on the endotracheal tube to place between the vocal cords for stimulation of the nerve and monitoring the recurrent laryngeal nerves through the case. This was done under direct vision by the anesthesia group. A shoulder roll was placed. The neck was marked at a skin crease near the suprasternal notch. 5 mL of 1% Xylocaine with epi 1:100,000 was used for infiltration of the skin for vasoconstriction. She was prepped and draped sterile fashion.  An incision was created the following the previously marked skin crease and was carried through skin and subcutaneous tissue. There was minimal bleeding and this was controlled electrocautery. The platysma layer was opened and there were very large anterior jugular veins. These were dissected and cut across and tied superiorly and inferiorly. The strap muscles were divided in the midline and the thyroid could be seen. It is fairly watery and edematous. Dissection was carried first on the left side around the lateral border of the thyroid gland. The strap muscles were elevated off of this. Dissection was carried superiorly and the superior thyroid vessels were dissected out and cut with the Harmonic scalpel. A superior parathyroid gland was evident here. The superior gland had several large nodules and  it was very difficult to get around it but eventually was completely freed up. Dissection was then carried more inferiorly and some the inferior thyroid vessels were cut across with the Harmonic scalpel as well. The inferior parathyroid gland was found here and freed up from the gland and left intact as well. The gland was rotated medially and the recurrent laryngeal nerve was found and stimulated well. Dissection was carried along the nerve to the junction with the larynx and the gland was freed above this. Berry's ligament was cut across to free this from the superior trachea and remaining inferior tracheal attachments were freed up.  The strap muscles were then elevated over the right side and the lateral attachments of the thyroid gland were freed up. Dissection was carried then superiorly. There is large nodule superiorly that were freed and the superior laryngeal vessels were cut across with the Harmonic scalpel. The superior laryngeal nerve was noted on the right side and left intact. The superior pole was pulled down and the gland was rotated laterally. Some the inferior vessels were then cut across and the gland was rotated even more medially. The recurrent laryngeal nerve was found lying the groove and traced superiorly. It was freed up from of Berry's ligament. There appeared to be possibly some parathyroid tissue superiorly but couldn't tell for sure. The inferior attachments to the trachea were then freed up. There was some pyramidal lobe came up over the thyroid notch. This was all freed up and removed with the gland. The gland was then tagged in the right superior pole was sent to  pathology as a whole specimen in formalin.  The peritracheal areas were then revisualized and were copiously irrigated. There is no significant bleeding on either side. The recurrent laryngeal nerves were evident on both sides and stimulated again with good movement. The parathyroid glands were elevated on the left side  superiorly and inferiorly but not evident on the right side. Surgicel was placed into the bed on both sides followed by a 7 TLS drain that crisscrossed in the midline.  For closure was done first by tagging the strap muscles together loosely and then closing platysmal layer with 40 Vicryls. The dermal layer was then closed with 4-0 Vicryl as well.  the skin edges were held in apposition using 6-0 nylon in a running locking suture. The wound was covered with little bacitracin followed by Telfa and a Tegaderm dressing. The drains are placed to low continuous Vacutainer suction. She was awakened and taken to the recovery room in satisfactory condition there were no operative complications  Dispo:   To PACU to then be sent to the floor to be observed overnight.  Plan:  We will watch drains and plan to remove in the morning if drainage is stopped. The drains were changed every 4 hours. She'll elevator head and will start a liquid diet advanced as tolerated. We'll check calcium level first thing in the morning to make sure her calcium levels are normal.  Denise Saunders H  09/22/2016 10:24 AM

## 2016-09-22 NOTE — Anesthesia Procedure Notes (Addendum)
Procedure Name: Intubation Date/Time: 09/22/2016 7:28 AM Performed by: Doreen Salvage Pre-anesthesia Checklist: Patient identified, Patient being monitored, Timeout performed, Emergency Drugs available and Suction available Patient Re-evaluated:Patient Re-evaluated prior to induction Oxygen Delivery Method: Circle system utilized Preoxygenation: Pre-oxygenation with 100% oxygen Induction Type: IV induction Ventilation: Mask ventilation without difficulty and Oral airway inserted - appropriate to patient size Laryngoscope Size: Mac and 3 Grade View: Grade I Tube type: Oral Tube size: 7.0 mm Number of attempts: 1 Airway Equipment and Method: Stylet Placement Confirmation: ETT inserted through vocal cords under direct vision,  positive ETCO2 and breath sounds checked- equal and bilateral Secured at: 21 cm Tube secured with: Tape Dental Injury: Teeth and Oropharynx as per pre-operative assessment

## 2016-09-23 DIAGNOSIS — E0520 Thyrotoxicosis with toxic multinodular goiter without thyrotoxic crisis or storm: Secondary | ICD-10-CM | POA: Diagnosis not present

## 2016-09-23 LAB — GLUCOSE, CAPILLARY: GLUCOSE-CAPILLARY: 143 mg/dL — AB (ref 65–99)

## 2016-09-23 LAB — CALCIUM: CALCIUM: 8.6 mg/dL — AB (ref 8.9–10.3)

## 2016-09-23 NOTE — Discharge Summary (Signed)
Physician Discharge Summary  Patient ID: Denise Saunders MRN: [id number] DOB/AGE: [birth year] 56 y.o.  Admit date: 09/22/2016 Discharge date: 09/23/2016  Admission Diagnoses:Multinodular goiter with hyperthyroidism  Discharge Diagnoses: Same Active Problems:   Thyroid mass of unclear etiology   Discharged Condition: good  Hospital Course: The patient had total thyroidectomy done yesterday morning. Her postop course has been unremarkable. Pain controlled well. Drains remove this morning and no swelling or bruising at the wound site. Her voice is clear and she is breathing easily. She coughed up some greenish stuff this morning and had a scratchy throat from prior to surgery. She thinks she may have caught something from her husband who is been sick. No fever noted here in the hospital. To be discharged this morning and follow up in the office next Monday for suture removal.  Consults: None  Significant Diagnostic Studies: labs: Calcium 8.6 this morning  Treatments: surgery: Total thyroidectomy  Discharge Exam: Blood pressure (!) 100/56, pulse 91, temperature 98 F (36.7 C), temperature source Oral, resp. rate 16, height 5\' 6"  (1.676 m), weight 73.5 kg (162 lb 1.6 oz), SpO2 95 %. Neck wound is flat and drains are removed. No leakage. Her voice is clear.  Disposition: 01-Home or Self Care  Discharge Instructions    Call MD for:  hives    Complete by:  As directed    Call MD for:  persistant nausea and vomiting    Complete by:  As directed    Call MD for:  redness, tenderness, or signs of infection (pain, swelling, redness, odor or green/yellow discharge around incision site)    Complete by:  As directed    Call MD for:  temperature >100.4    Complete by:  As directed    Diet general    Complete by:  As directed    Increase activity slowly    Complete by:  As directed      Allergies as of 09/23/2016   No Known Allergies     Medication List    TAKE these medications    atorvastatin 40 MG tablet Commonly known as:  LIPITOR Take 40 mg by mouth every evening.   estradiol 0.1 MG/GM vaginal cream Commonly known as:  ESTRACE Place 1 Applicatorful vaginally 2 (two) times a week.   FARXIGA 10 MG Tabs tablet Generic drug:  dapagliflozin propanediol Take 10 mg by mouth daily.   gabapentin 300 MG capsule Commonly known as:  NEURONTIN Take 300 mg by mouth 2 (two) times daily. lUNCH AND bEDTIME   glipiZIDE 10 MG tablet Commonly known as:  GLUCOTROL Take 10 mg by mouth every evening.   metFORMIN 1000 MG tablet Commonly known as:  GLUCOPHAGE Take 1,000 mg by mouth 2 (two) times daily with a meal.   omeprazole 20 MG tablet Commonly known as:  PRILOSEC OTC Take 20 mg by mouth as needed.   venlafaxine XR 150 MG 24 hr capsule Commonly known as:  EFFEXOR-XR Take 150 mg by mouth at bedtime.        Signed: Taneka Espiritu H 09/23/2016, 7:45 AM

## 2016-09-24 LAB — SURGICAL PATHOLOGY

## 2018-05-02 ENCOUNTER — Ambulatory Visit (INDEPENDENT_AMBULATORY_CARE_PROVIDER_SITE_OTHER): Payer: BLUE CROSS/BLUE SHIELD

## 2018-05-02 ENCOUNTER — Encounter: Payer: Self-pay | Admitting: Gynecology

## 2018-05-02 ENCOUNTER — Ambulatory Visit
Admission: EM | Admit: 2018-05-02 | Discharge: 2018-05-02 | Disposition: A | Discharge status: Home / Self Care | Payer: BLUE CROSS/BLUE SHIELD

## 2018-05-02 ENCOUNTER — Other Ambulatory Visit: Payer: Self-pay

## 2018-05-02 DIAGNOSIS — J209 Acute bronchitis, unspecified: Secondary | ICD-10-CM

## 2018-05-02 DIAGNOSIS — R05 Cough: Secondary | ICD-10-CM

## 2018-05-02 DIAGNOSIS — R059 Cough, unspecified: Secondary | ICD-10-CM

## 2018-05-02 MED ORDER — AZITHROMYCIN 250 MG PO TABS: 250.0000 mg | ORAL_TABLET | Freq: Every day | ORAL | 0 refills | Status: DC

## 2018-05-02 MED ORDER — GUAIFENESIN-CODEINE 100-10 MG/5ML PO SYRP: ORAL_SOLUTION | ORAL | 0 refills | Status: DC

## 2018-05-02 MED ORDER — ALBUTEROL SULFATE HFA 108 (90 BASE) MCG/ACT IN AERS: 1.0000 | INHALATION_SPRAY | Freq: Four times a day (QID) | RESPIRATORY_TRACT | 0 refills | Status: DC | PRN

## 2018-05-02 NOTE — ED Provider Notes (Signed)
[facility]    CSN: [id number] Arrival date & time: 05/02/18  1015     History   Chief Complaint No chief complaint on file.   HPI Denise Saunders is a 58 y.o. female. Patient presents for 1 week history of productive cough. She was seen by her PCP last week and treated for bronchitis with prednisone, tessalon perles, and nasal spray. She states that she is feeling worse despite taking those medications. Denies known fever. Admits to fatigue and generalized aches. She denies chills and sweats. Admits to some shortness of breath and wheezing since onset. She has no history of asthma or COPD. She says does occasionally get prescribed inhalers when she gets sick. Today, she believes she needs something stronger for her cough and an antibiotic. She denies any other concerns today.  HPI  Past Medical History:  Diagnosis Date  . Diabetes mellitus without complication ([place])   . Family history of adverse reaction to anesthesia    MOM-N/V AND IS HARD TO WAKE  . GERD (gastroesophageal reflux disease)    OCC  . Pneumonia 2016    Patient Active Problem List   Diagnosis Date Noted  . Thyroid mass of unclear etiology 09/22/2016    Past Surgical History:  Procedure Laterality Date  . CESAREAN SECTION    . THYROIDECTOMY N/A 09/22/2016   Procedure: THYROIDECTOMY;  Surgeon: Margaretha Sheffield, MD;  Location: [facility] ORS;  Service: ENT;  Laterality: N/A;    OB History   No obstetric history on file.      Home Medications    Prior to Admission medications   Medication Sig Start Date End Date Taking? Authorizing Provider  atorvastatin (LIPITOR) 40 MG tablet Take 40 mg by mouth every evening.   Yes [provider]  dapagliflozin propanediol (FARXIGA) 10 MG TABS tablet Take 10 mg by mouth daily.   Yes [provider]  estradiol (ESTRACE) 0.1 MG/GM vaginal cream Place 1 Applicatorful vaginally 2 (two) times a week.   Yes [provider]  gabapentin  (NEURONTIN) 300 MG capsule Take 300 mg by mouth 2 (two) times daily. lUNCH AND bEDTIME   Yes [provider]  glipiZIDE (GLUCOTROL) 10 MG tablet Take 10 mg by mouth every evening.   Yes [provider]  metFORMIN (GLUCOPHAGE) 1000 MG tablet Take 1,000 mg by mouth 2 (two) times daily with a meal.   Yes [provider]  venlafaxine XR (EFFEXOR-XR) 150 MG 24 hr capsule Take 150 mg by mouth at bedtime.   Yes [provider]  albuterol (PROVENTIL HFA;VENTOLIN HFA) 108 (90 Base) MCG/ACT inhaler Inhale 1-2 puffs into the lungs every 6 (six) hours as needed for wheezing or shortness of breath. 05/02/18   Danton Clap, PA-C  azithromycin (ZITHROMAX) 250 MG tablet Take 1 tablet (250 mg total) by mouth daily. Take first 2 tablets together, then 1 every day until finished. 05/02/18   Danton Clap, PA-C  guaiFENesin-codeine [facility]) 100-10 MG/5ML syrup Take 5-10 ml PO q6h prn for cough x 7 days 05/02/18   Laurene Footman B, PA-C  omeprazole (PRILOSEC OTC) 20 MG tablet Take 20 mg by mouth as needed.    [provider]    Family History History reviewed. No pertinent family history.  Social History Social History   Tobacco Use  . Smoking status: Current Some Day Smoker    Packs/day: 0.25    Years: 15.00    Pack years: 3.75    Types: Cigarettes  .  Smokeless tobacco: Never Used  Substance Use Topics  . Alcohol use: No  . Drug use: No     Allergies   Dulaglutide   Review of Systems Review of Systems  Constitutional: Positive for fatigue. Negative for chills and fever.  HENT: Positive for congestion and rhinorrhea. Negative for ear pain, sinus pressure, sinus pain and sore throat.   Respiratory: Positive for cough, shortness of breath and wheezing.   Cardiovascular: Negative for chest pain.  Gastrointestinal: Negative for abdominal pain, nausea and vomiting.  Musculoskeletal: Positive for arthralgias and myalgias. Negative for back pain and  neck pain.  Skin: Negative for rash.  Neurological: Negative for dizziness and headaches.     Physical Exam Triage Vital Signs ED Triage Vitals  Enc Vitals Group     BP 05/02/18 1047 103/67     Pulse Rate 05/02/18 1047 (!) 128     Resp 05/02/18 1047 16     Temp 05/02/18 1047 100 F (37.8 C)     Temp Source 05/02/18 1047 Oral     SpO2 05/02/18 1047 94 %     Weight 05/02/18 1049 160 lb (72.6 kg)     Height 05/02/18 1049 5\' 6"  (1.676 m)     Head Circumference --      Peak Flow --      Pain Score --      Pain Loc --      Pain Edu? --      Excl. in [place]? --    No data found.  Updated Vital Signs BP 103/67 (BP Location: Left Arm)   Pulse (!) 125   Temp 100 F (37.8 C) (Oral)   Resp 16   Ht 5\' 6"  (1.676 m)   Wt 160 lb (72.6 kg)   SpO2 95%   BMI 25.82 kg/m    Physical Exam Vitals signs and nursing note reviewed.  Constitutional:      General: She is not in acute distress.    Appearance: Normal appearance. She is normal weight. She is ill-appearing.  HENT:     Head: Normocephalic and atraumatic.     Right Ear: Tympanic membrane, ear canal and external ear normal.     Left Ear: Tympanic membrane, ear canal and external ear normal.     Nose: Congestion and rhinorrhea (trace clear rhinorrhea) present.     Mouth/Throat:     Mouth: Mucous membranes are moist.     Pharynx: Oropharynx is clear. No posterior oropharyngeal erythema.  Eyes:     General: No scleral icterus.       Right eye: No discharge.        Left eye: No discharge.     Conjunctiva/sclera: Conjunctivae normal.  Neck:     Musculoskeletal: Neck supple.  Cardiovascular:     Rate and Rhythm: Regular rhythm. Tachycardia present.     Heart sounds: Normal heart sounds. No murmur.  Pulmonary:     Effort: No respiratory distress.     Breath sounds: Wheezing (few scattered wheezes throughout lungs bilaterally ) present. No rhonchi or rales.     Comments: Unable to take full deep breath w/o having  cough Lymphadenopathy:     Cervical: Cervical adenopathy present.  Skin:    General: Skin is warm and dry.     Findings: No rash.  Neurological:     General: No focal deficit present.     Mental Status: She is alert and oriented to person, place, and time.  Motor: No weakness.     Gait: Gait normal.  Psychiatric:        Mood and Affect: Mood normal.        Behavior: Behavior normal.        Thought Content: Thought content normal.      UC Treatments / Results  Labs (all labs ordered are listed, but only abnormal results are displayed) Labs Reviewed - No data to display  EKG None  Radiology Dg Chest 2 View  Result Date: 05/02/2018 CLINICAL DATA:  Cough and congestion for 1 week. EXAM: CHEST - 2 VIEW COMPARISON:  06/15/2015 FINDINGS: Cardiomediastinal silhouette is normal. Mediastinal contours appear intact. There is no evidence of focal airspace consolidation, pleural effusion or pneumothorax. Mild compression deformity of T10 vertebral body. Soft tissues are grossly normal. IMPRESSION: No active cardiopulmonary disease. Mild compression deformity of the superior endplate of L79 vertebral body. Please correlate to possible point tenderness. Electronically Signed   By: Fidela Salisbury M.D.   On: 05/02/2018 11:53    Procedures Procedures (including critical care time)  Medications Ordered in UC Medications - No data to display  Initial Impression / Assessment and Plan / UC Course  I have reviewed the triage vital signs and the nursing notes.  Pertinent labs & imaging results that were available during my care of the patient were reviewed by me and considered in my medical decision making (see chart for details).     Final Clinical Impressions(s) / UC Diagnoses   Final diagnoses:  Acute bronchitis, unspecified organism  Cough     Discharge Instructions     BRONCHITIS: Your x-ray was negative for signs of pnuemonia today. This is likely bronchitis. Most cases  of bronchitis are viral, but you have been ill and worsening for a week and have already tried prednisone and cough suppressants w/o improvement in symptoms. I believe it is reasonable to treat with azithromycin at this time. Take Mucinex D or prescription cough medication throughout the day and drink plenty of fluids to break up the mucus . Take cough syrup at bedtime if needed, for cough and to assist with sleep. Take Ibuprofen or other NSAID for relief of any pleuritic pain. Use albuterol as needed. Increase rest and fluid intake. Return to the clinic, your PCP, or ER if you have any new/ worsening symptoms such as fever, chest pain, difficulty breathing, worsening cough, mental status changes, lethargy, etc.     ED Prescriptions    Medication Sig Dispense Auth. Provider   azithromycin (ZITHROMAX) 250 MG tablet Take 1 tablet (250 mg total) by mouth daily. Take first 2 tablets together, then 1 every day until finished. 6 tablet Laurene Footman B, PA-C   albuterol (PROVENTIL HFA;VENTOLIN HFA) 108 (90 Base) MCG/ACT inhaler Inhale 1-2 puffs into the lungs every 6 (six) hours as needed for wheezing or shortness of breath. 1 Inhaler Danton Clap, PA-C   guaiFENesin-codeine (CHERATUSSIN AC) 100-10 MG/5ML syrup Take 5-10 ml PO q6h prn for cough x 7 days 200 mL Laurene Footman B, PA-C     Controlled Substance Prescriptions [place] Controlled Substance Registry consulted? Yes, I have consulted the [place] Controlled Substances Registry for this patient, and feel the risk/benefit ratio today is favorable for proceeding with this prescription for a controlled substance.   Danton Clap, PA-C 05/02/18 [id number]

## 2018-05-02 NOTE — Discharge Instructions (Addendum)
BRONCHITIS: Your x-ray was negative for signs of pnuemonia today. This is likely bronchitis. Most cases of bronchitis are viral, but you have been ill and worsening for a week and have already tried prednisone and cough suppressants w/o improvement in symptoms. I believe it is reasonable to treat with azithromycin at this time. Take Mucinex D or prescription cough medication throughout the day and drink plenty of fluids to break up the mucus . Take cough syrup at bedtime if needed, for cough and to assist with sleep. Take Ibuprofen or other NSAID for relief of any pleuritic pain. Use albuterol as needed. Increase rest and fluid intake. Return to the clinic, your PCP, or ER if you have any new/ worsening symptoms such as fever, chest pain, difficulty breathing, worsening cough, mental status changes, lethargy, etc.

## 2018-05-02 NOTE — ED Triage Notes (Signed)
Patient c/o was seen by her primary care x 5 days and was treated for Bronchitis. Patient stated finish her medication x yesterday but not feeling any better. Per patient request refill on her inhaler.

## 2019-01-09 IMAGING — NM NM THYROID IMAGING W/ UPTAKE MULTI (4&24 HR)
1 series · 3 of 3 positions shown · non-contrast
Comparison: Ultrasound 06/27/2016.

CLINICAL DATA: Hyperthyroidism decreased appetite with weight loss
and diarrhea. Recent bilateral thyroid biopsy negative for
malignancy.

EXAM:
THYROID SCAN AND UPTAKE - 4 AND 24 HOURS
TECHNIQUE: Following oral administration of I 123 capsule, anterior planar
imaging was acquired at 24 hours. Thyroid uptake was calculated with
a thyroid probe at 4-6 hours and 24 hours.
RADIOPHARMACEUTICALS:  158.5 uCi I -123

[Series 1000: (id) thyroid scan · 2.40mm/px · 3 of 3 slices shown]
[im 1/3  full-range]
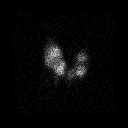
[im 2/3  full-range]
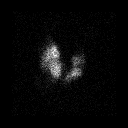
[im 3/3  full-range]
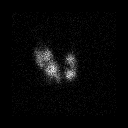

[3 of 3 positions shown; findings below may reference images not displayed]

FINDINGS: 4 hour I 123 uptake = 13.1% (normal 5-35%)

24 hour I 123 uptake = 33.0% (normal 5-35%)

Thyroid imaging demonstrates heterogeneous activity with multiple
warm nodules and relative suppression of the remainder the gland. No
dominant hot nodule identified.
IMPRESSION: 1. Multinodular thyroid uptake consistent with multinodular goiter.
2. Radio iodine uptake within normal limits (near the upper limits
of normal at 24 hours).

## 2020-10-02 IMAGING — CR DG CHEST 2V
2 series · 2 of 2 positions shown · non-contrast
Comparison: 06/15/2015

CLINICAL DATA: Cough and congestion for 1 week.

EXAM:
CHEST - 2 VIEW

[chest pa]
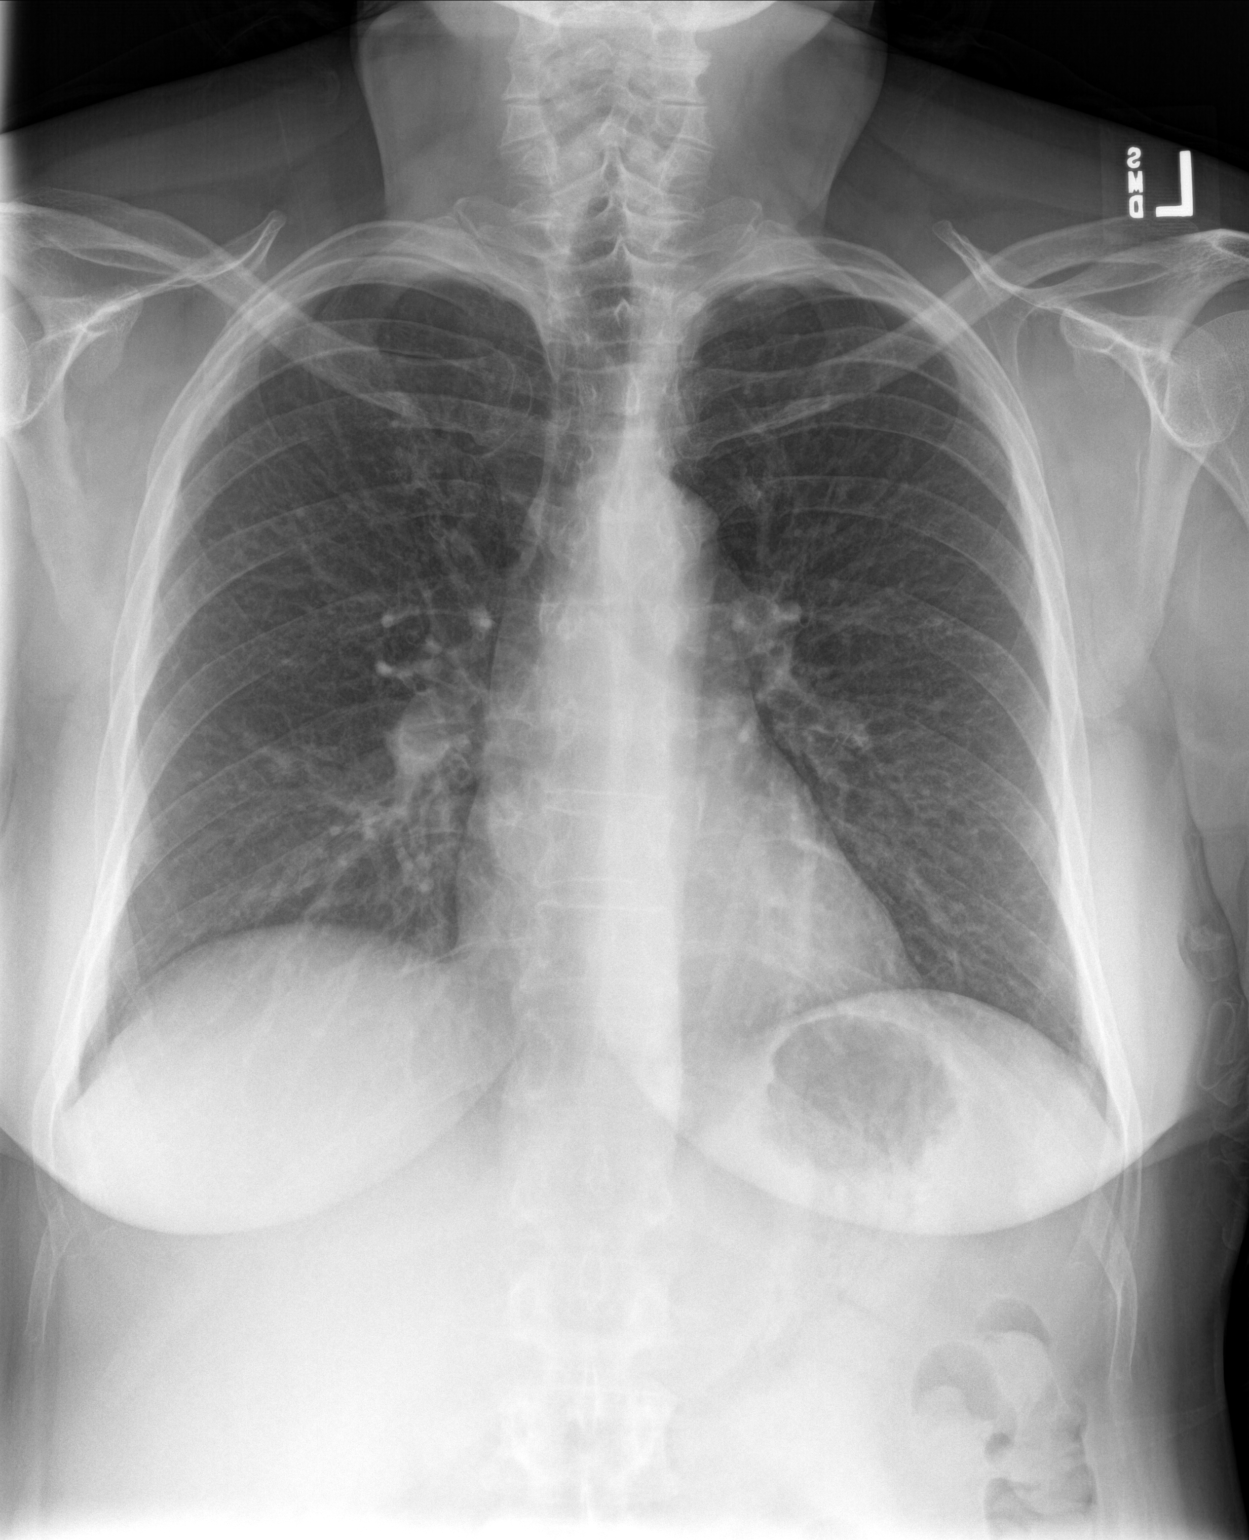

[chest lat]
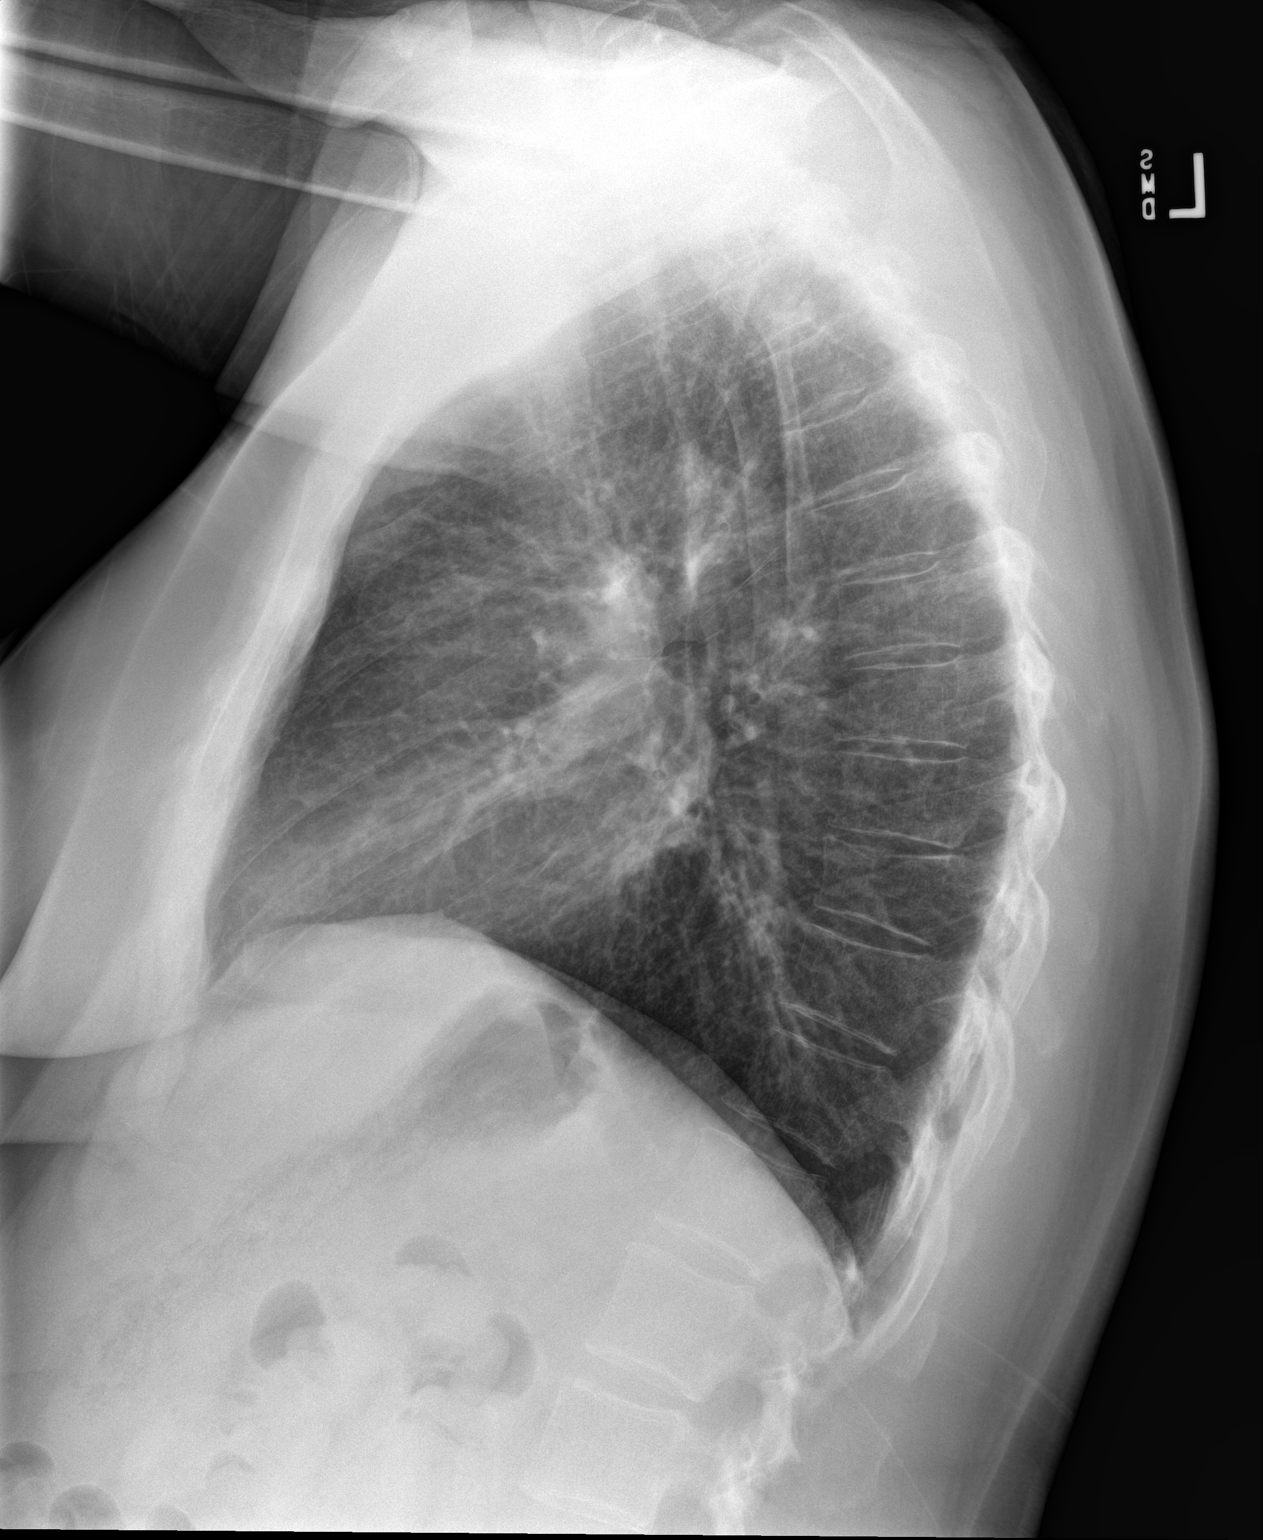

[2 of 2 positions shown; findings below may reference images not displayed]

FINDINGS: Cardiomediastinal silhouette is normal. Mediastinal contours appear
intact.

There is no evidence of focal airspace consolidation, pleural
effusion or pneumothorax.

Mild compression deformity of T10 vertebral body. Soft tissues are
grossly normal.
IMPRESSION: No active cardiopulmonary disease.

Mild compression deformity of the superior endplate of T10 vertebral
body. Please correlate to possible point tenderness.

## 2021-11-12 ENCOUNTER — Other Ambulatory Visit: Payer: Self-pay | Admitting: Physician Assistant

## 2021-11-12 DIAGNOSIS — Z1231 Encounter for screening mammogram for malignant neoplasm of breast: Secondary | ICD-10-CM

## 2021-11-28 ENCOUNTER — Ambulatory Visit
Admission: RE | Admit: 2021-11-28 | Discharge: 2021-11-28 | Disposition: A | Discharge status: Home / Self Care | Payer: BC Managed Care – PPO | Source: Ambulatory Visit | Attending: Physician Assistant | Admitting: Physician Assistant

## 2021-11-28 DIAGNOSIS — Z1231 Encounter for screening mammogram for malignant neoplasm of breast: Secondary | ICD-10-CM | POA: Insufficient documentation

## 2021-11-29 ENCOUNTER — Other Ambulatory Visit: Payer: Self-pay | Admitting: Physician Assistant

## 2021-11-29 DIAGNOSIS — N63 Unspecified lump in unspecified breast: Secondary | ICD-10-CM

## 2021-11-29 DIAGNOSIS — R928 Other abnormal and inconclusive findings on diagnostic imaging of breast: Secondary | ICD-10-CM

## 2021-11-29 DIAGNOSIS — N6489 Other specified disorders of breast: Secondary | ICD-10-CM

## 2021-12-02 ENCOUNTER — Other Ambulatory Visit: Payer: Self-pay | Admitting: Physician Assistant

## 2021-12-02 DIAGNOSIS — N63 Unspecified lump in unspecified breast: Secondary | ICD-10-CM

## 2021-12-02 DIAGNOSIS — R928 Other abnormal and inconclusive findings on diagnostic imaging of breast: Secondary | ICD-10-CM

## 2021-12-02 DIAGNOSIS — N6489 Other specified disorders of breast: Secondary | ICD-10-CM

## 2021-12-17 ENCOUNTER — Ambulatory Visit
Admission: RE | Admit: 2021-12-17 | Discharge: 2021-12-17 | Disposition: A | Discharge status: Home / Self Care | Payer: BC Managed Care – PPO | Source: Ambulatory Visit | Attending: Physician Assistant | Admitting: Physician Assistant

## 2021-12-17 DIAGNOSIS — N6489 Other specified disorders of breast: Secondary | ICD-10-CM

## 2021-12-17 DIAGNOSIS — R928 Other abnormal and inconclusive findings on diagnostic imaging of breast: Secondary | ICD-10-CM

## 2021-12-17 DIAGNOSIS — N63 Unspecified lump in unspecified breast: Secondary | ICD-10-CM | POA: Diagnosis present

## 2021-12-19 ENCOUNTER — Other Ambulatory Visit: Payer: Self-pay | Admitting: Internal Medicine

## 2021-12-19 DIAGNOSIS — R928 Other abnormal and inconclusive findings on diagnostic imaging of breast: Secondary | ICD-10-CM

## 2021-12-19 DIAGNOSIS — N63 Unspecified lump in unspecified breast: Secondary | ICD-10-CM

## 2021-12-31 ENCOUNTER — Ambulatory Visit
Admission: RE | Admit: 2021-12-31 | Discharge: 2021-12-31 | Disposition: A | Discharge status: Home / Self Care | Payer: BC Managed Care – PPO | Source: Ambulatory Visit | Attending: Internal Medicine | Admitting: Internal Medicine

## 2021-12-31 ENCOUNTER — Other Ambulatory Visit: Payer: Self-pay | Admitting: Internal Medicine

## 2021-12-31 DIAGNOSIS — R928 Other abnormal and inconclusive findings on diagnostic imaging of breast: Secondary | ICD-10-CM

## 2021-12-31 DIAGNOSIS — N63 Unspecified lump in unspecified breast: Secondary | ICD-10-CM

## 2021-12-31 HISTORY — PX: BREAST BIOPSY: SHX20

## 2021-12-31 MED ORDER — LIDOCAINE HCL (PF) 1 % IJ SOLN
9.0000 mL | Freq: Once | INTRAMUSCULAR | Status: AC
  Administered 2021-12-31: 9 mL via INTRADERMAL

## 2021-12-31 MED ORDER — LIDOCAINE-EPINEPHRINE 1 %-1:100000 IJ SOLN
9.0000 mL | Freq: Once | INTRAMUSCULAR | Status: AC
  Administered 2021-12-31: 9 mL via INTRADERMAL

## 2022-01-01 ENCOUNTER — Encounter: Payer: Self-pay | Admitting: *Deleted

## 2022-01-01 DIAGNOSIS — C50919 Malignant neoplasm of unspecified site of unspecified female breast: Secondary | ICD-10-CM

## 2022-01-01 LAB — SURGICAL PATHOLOGY

## 2022-01-01 NOTE — Progress Notes (Unsigned)
Received referral for newly diagnosed breast cancer from [facility].  Navigation initiated.  She will see Dr. Darrall Dears on 10/30 and Dr. Peyton Najjar 11/2.   Appointment information given to her.

## 2022-01-06 ENCOUNTER — Inpatient Hospital Stay: Payer: BC Managed Care – PPO

## 2022-01-06 ENCOUNTER — Inpatient Hospital Stay: Payer: BC Managed Care – PPO | Attending: Internal Medicine | Admitting: Internal Medicine

## 2022-01-06 ENCOUNTER — Encounter: Payer: Self-pay | Admitting: Internal Medicine

## 2022-01-06 ENCOUNTER — Encounter: Payer: Self-pay | Admitting: *Deleted

## 2022-01-06 DIAGNOSIS — E079 Disorder of thyroid, unspecified: Secondary | ICD-10-CM | POA: Insufficient documentation

## 2022-01-06 DIAGNOSIS — Z803 Family history of malignant neoplasm of breast: Secondary | ICD-10-CM

## 2022-01-06 DIAGNOSIS — C50911 Malignant neoplasm of unspecified site of right female breast: Secondary | ICD-10-CM | POA: Insufficient documentation

## 2022-01-06 DIAGNOSIS — F17210 Nicotine dependence, cigarettes, uncomplicated: Secondary | ICD-10-CM | POA: Diagnosis not present

## 2022-01-06 DIAGNOSIS — Z170 Estrogen receptor positive status [ER+]: Secondary | ICD-10-CM

## 2022-01-06 DIAGNOSIS — Z808 Family history of malignant neoplasm of other organs or systems: Secondary | ICD-10-CM | POA: Insufficient documentation

## 2022-01-06 NOTE — Progress Notes (Signed)
Met with patient and husband during initial medical oncology visit. Reviewed Breast Cancer treatment handbook.   Care plan summary given to patient.   Reviewed outreach programs and cancer center services.

## 2022-01-06 NOTE — Progress Notes (Signed)
[place] CONSULT NOTE  Patient Care Team: Sena Hitch Maximiano Coss as PCP - General (Physician Assistant) Daiva Huge, RN as Oncology Nurse Navigator  REFERRING PROVIDER: Mychal Beckie Busing, PA  REASON FOR REFFERAL: right breast cancer  CANCER STAGING    Cancer Staging  Breast cancer, right [facility]) Staging form: Breast, AJCC 8th Edition - Clinical: Stage IA (cT1b, cN0, cM0, G1, ER+, PR+, HER2-) - Signed by Jane Canary, MD on 01/06/2022 Stage prefix: Initial diagnosis Nuclear grade: G1 Histologic grading system: 3 grade system   ASSESSMENT & PLAN:  Denise Saunders 61 y.o. 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  # Right breast IDC, Stage 1A, ER/PR+, HER2- -detected on screening mammogram.  - Discussed in detail about the cancer diagnosis, prognosis and treatment options. The first step in the management is surgery which entails lumpectomy versus mastectomy.  Patient is scheduled to see Dr. Peyton Najjar on Thursday.  Depending patient's decision, we will have radiation oncology consult.  The tumor is more than 5 mm in size so we will send for Oncotype testing on surgical specimen to help [place] make decision about chemotherapy.  We discussed about aromatase inhibitors which include letrozole, anastrozole and exemestane.  Duration of treatment will likely be 5 years.  Side effects were discussed including but not limited to myalgias, arthralgias, mood changes, hot flashes, thinning of bone.  Advised to take calcium and vitamin D supplements.  Encouraged weightbearing exercises to help with bone health.  discussed Aromatase inhibitors versus tamoxifen in early breast cancer: patient-level meta-analysis of the randomised trials. Aromatase inhibitors reduce recurrence rates by about 30% (proportionately) compared with  tamoxifen while treatments differ, but not thereafter. 5 years of an aromatase inhibitor reduces 10-year breast cancer mortality rates by about 15% compared with 5 years of tamoxifen, hence by about 40% (proportionately) compared with no endocrine treatment.   Patient reports she had DEXA scan a year ago with her primary.  Unable to find the report.  We will repeat DEXA scan next time.  #Family history of cancer -Sister recently diagnosed with triple negative breast cancer and history of colon cancer.  Sister's genetic testing was negative.  Father had history of head cancer -Referral to genetics     Orders Placed This Encounter  Procedures   Ambulatory referral to Genetics    Referral Priority:   Routine    Referral Type:   Consultation    Referral Reason:   Specialty Services Required    Number of Visits Requested:   1   RTC in 3 weeks after surgery for MD visit.  The total time spent in the appointment was 60 minutes encounter with patients including review of chart and various tests results, discussions about plan of care and coordination of care plan   All questions were answered. The patient knows to call the clinic with any problems, questions or concerns. No barriers to learning was detected.  Jane Canary, MD 10/30/202312:18 PM   HISTORY OF PRESENTING ILLNESS:  Denise Saunders 61 y.o. 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  Patient was seen today accompanied by her husband.  She reports feeling well.  Denies any breast pain or discharge.  Her appetite is good.  I have reviewed her chart and materials related to her cancer extensively and collaborated history with the patient. Summary of oncologic history is as follows: Oncology History  Breast cancer, right ([place])  11/28/2021 Mammogram   Screening  mammogram FINDINGS: In the right breast possible masses in the outer breast require further evaluation.   In the left breast possible asymmetry in the upper breast in the oblique projection requires further evaluation   12/17/2021 Mammogram   Diagnostic mammogram and US IMPRESSION: 1. Suspicious spiculated 0.8 cm mass containing calcifications in the right breast at 9:30 4-5 cm from nipple.   2. Indeterminate 0.6 cm mass in the right breast at 9 o'clock 4 cm from nipple, possibly an intramammary lymph node.   4. Indeterminate 0.5 cm mass in the right breast at 9 o'clock 1 cm from the nipple.  [place] of bilateral axilla negative   12/31/2021 Pathology Results   DIAGNOSIS:  A. BREAST, RIGHT AT 930 O'CLOCK, 4-5 CM FROM THE NIPPLE;  ULTRASOUND-GUIDED CORE NEEDLE BIOPSY:  - INVASIVE MAMMARY CARCINOMA, WITH TUBULAR FEATURES.   Size of invasive carcinoma: 6 mm in this sample  Histologic grade of invasive carcinoma: Grade 1                       Glandular/tubular differentiation score: 1                       Nuclear pleomorphism score: 1                       Mitotic rate score: 1                       Total score: 3  Ductal carcinoma in situ: Present, low-grade  Lymphovascular invasion: Not identified   B. BREAST, RIGHT AT 9:00, 4 CM FROM NIPPLE; ULTRASOUND-GUIDED CORE  NEEDLE BIOPSY:  - FRAGMENTS OF BENIGN INTRAMAMMARY LYMPH NODE.  - FOCAL BACKGROUND BENIGN MAMMARY PARENCHYMA.  - NO EVIDENCE OF MALIGNANCY.   ADDENDUM:  CASE SUMMARY: BREAST BIOMARKER TESTS  Estrogen Receptor (ER) Status: POSITIVE          Percentage of cells with nuclear positivity: Greater than 90%          Average intensity of staining: Strong   Progesterone Receptor (PgR) Status: POSITIVE          Percentage of cells with nuclear positivity: Greater than 90%          Average intensity of staining: Strong   HER2 (by immunohistochemistry): NEGATIVE (Score 1+)  Ki-67: Not performed   under direct  ultrasound visualization, needle aspiration of a mass at 9 o'clock 1 cm from the nipple was performed. Aspirated fluid has a benign appearance so was discarded.    01/06/2022 Cancer Staging   Staging form: Breast, AJCC 8th Edition - Clinical: Stage IA (cT1b, cN0, cM0, G1, ER+, PR+, HER2-) - Signed by Jane Canary, MD on 01/06/2022 Stage prefix: Initial diagnosis Nuclear grade: G1 Histologic grading system: 3 grade system    Menarche unsure Children 1 son Age at first birth 38 Birth control OCP more than 5 years Menopause age 34 Hysterectomy no HRT no  MEDICAL HISTORY:  Past Medical History:  Diagnosis Date   Diabetes mellitus without complication ([place])    Family history of adverse reaction to anesthesia    MOM-N/V AND IS HARD TO WAKE   GERD (gastroesophageal reflux disease)  [place]   Pneumonia 2016    SURGICAL HISTORY: Past Surgical History:  Procedure Laterality Date   BREAST BIOPSY Right 12/31/2021   [place] Bx, ribbon, path pending   BREAST BIOPSY Right 12/31/2021   [place] Bx, Heart Clip, path pending   CESAREAN SECTION     THYROIDECTOMY N/A 09/22/2016   Procedure: THYROIDECTOMY;  Surgeon: Margaretha Sheffield, MD;  Location: [facility] ORS;  Service: ENT;  Laterality: N/A;    SOCIAL HISTORY: Social History   Socioeconomic History   Marital status: Married    Spouse name: Not on file   Number of children: Not on file   Years of education: Not on file   Highest education level: Not on file  Occupational History   Not on file  Tobacco Use   Smoking status: Some Days    Packs/day: 0.25    Years: 15.00    Total pack years: 3.75    Types: Cigarettes   Smokeless tobacco: Never  Vaping Use   Vaping Use: Never used  Substance and Sexual Activity   Alcohol use: No   Drug use: No   Sexual activity: Not Currently    Birth control/protection: None  Other Topics Concern   Not on file  Social History Narrative   Not on file   Social Determinants of Health   Financial  Resource Strain: Not on file  Food Insecurity: Not on file  Transportation Needs: Not on file  Physical Activity: Not on file  Stress: Not on file  Social Connections: Not on file  Intimate Partner Violence: Not on file    FAMILY HISTORY: Family History  Problem Relation Age of Onset   Breast cancer Sister 71   Breast cancer Cousin     ALLERGIES:  is allergic to codeine, dulaglutide, and liraglutide.  MEDICATIONS:  Current Outpatient Medications  Medication Sig Dispense Refill   atorvastatin (LIPITOR) 40 MG tablet Take 40 mg by mouth every evening.     busPIRone (BUSPAR) 15 MG tablet Take 15 mg by mouth 2 (two) times daily.     Continuous Blood Gluc Sensor (FREESTYLE LIBRE 14 DAY SENSOR) MISC SMARTSIG:1 Kit(s) Topical As Directed     gabapentin (NEURONTIN) 300 MG capsule Take 300 mg by mouth 2 (two) times daily. lUNCH AND bEDTIME     glipiZIDE (GLUCOTROL) 10 MG tablet Take 10 mg by mouth every evening.     Insulin Pen Needle (PEN NEEDLES 31GX5/16") 31G X 8 MM MISC See admin instructions.     levothyroxine (SYNTHROID) 88 MCG tablet Take by mouth.     metFORMIN (GLUCOPHAGE) 1000 MG tablet Take 1,000 mg by mouth 2 (two) times daily with a meal.     NOVOLIN 70/30 KWIKPEN (70-30) 100 UNIT/ML KwikPen Inject into the skin.     omeprazole (PRILOSEC OTC) 20 MG tablet Take 20 mg by mouth as needed.     traZODone (DESYREL) 100 MG tablet Take 100 mg by mouth at bedtime.     venlafaxine XR (EFFEXOR-XR) 150 MG 24 hr capsule Take 150 mg by mouth at bedtime.     albuterol (PROVENTIL HFA;VENTOLIN HFA) 108 (90 Base) MCG/ACT inhaler Inhale 1-2 puffs into the lungs every 6 (six) hours as needed for wheezing or shortness of breath. (Patient not taking: Reported on 01/06/2022) 1 Inhaler 0   azithromycin (ZITHROMAX) 250 MG tablet Take 1 tablet (250 mg total) by mouth daily. Take first 2 tablets together, then 1 every day until finished. 6 tablet 0   dapagliflozin propanediol (FARXIGA)  10 MG TABS tablet  Take 10 mg by mouth daily. (Patient not taking: Reported on 01/06/2022)     estradiol (ESTRACE) 0.1 MG/GM vaginal cream Place 1 Applicatorful vaginally 2 (two) times a week. (Patient not taking: Reported on 01/06/2022)     guaiFENesin-codeine (CHERATUSSIN AC) 100-10 MG/5ML syrup Take 5-10 ml PO q6h prn for cough x 7 days 200 mL 0   No current facility-administered medications for this visit.    REVIEW OF SYSTEMS:   Pertinent information mentioned in HPI All other systems were reviewed with the patient and are negative.  PHYSICAL EXAMINATION: ECOG PERFORMANCE STATUS: 0 - Asymptomatic  Vitals:   01/06/22 1110  BP: 124/84  Pulse: (!) 120  Resp: 18  Temp: (!) 96.6 F (35.9 C)  SpO2: 95%   Filed Weights   01/06/22 1110  Weight: 158 lb 6.4 oz (71.8 kg)    GENERAL:alert, no distress and comfortable SKIN: skin color, texture, turgor are normal, no rashes or significant lesions EYES: normal, conjunctiva are pink and non-injected, sclera clear OROPHARYNX:no exudate, no erythema and lips, buccal mucosa, and tongue normal  NECK: supple, thyroid normal size, non-tender, without nodularity LYMPH:  no palpable lymphadenopathy in the cervical, axillary or inguinal LUNGS: clear to auscultation and percussion with normal breathing effort HEART: regular rate & rhythm and no murmurs and no lower extremity edema ABDOMEN:abdomen soft, non-tender and normal bowel sounds Musculoskeletal:no cyanosis of digits and no clubbing  PSYCH: alert & oriented x 3 with fluent speech NEURO: no focal motor/sensory deficits  LABORATORY DATA:  I have reviewed the data as listed Lab Results  Component Value Date   WBC 8.7 05/05/2011   HGB 14.1 05/05/2011   HCT 42.2 05/05/2011   MCV 92 05/05/2011   PLT 266 05/05/2011   No results for input(s): "NA", "K", "CL", "CO2", "GLUCOSE", "BUN", "CREATININE", "CALCIUM", "GFRNONAA", "GFRAA", "PROT", "ALBUMIN", "AST", "ALT", "ALKPHOS", "BILITOT", "BILIDIR", "IBILI" in  the last 8760 hours.  RADIOGRAPHIC STUDIES: I have personally reviewed the radiological images as listed and agreed with the findings in the report. [place] RT BREAST BX W LOC DEV 1ST LESION IMG BX SPEC US GUIDE  Addendum Date: 01/01/2022   ADDENDUM REPORT: 01/01/2022 14:58 ADDENDUM: PATHOLOGY revealed: Site A. BREAST, RIGHT AT 930 O'CLOCK, 4-5 CM FROM THE NIPPLE; ULTRASOUND-GUIDED CORE NEEDLE BIOPSY: - INVASIVE MAMMARY CARCINOMA, WITH TUBULAR FEATURES. Size of invasive carcinoma: 6 mm in this sample. Grade 1. Ductal carcinoma in situ: Present, low-grade Lymphovascular invasion: Not identified. Pathology results are CONCORDANT with imaging findings, per Dr. Valentino Saxon. PATHOLOGY revealed: Site B. BREAST, RIGHT AT 9:00, 4 CM FROM NIPPLE; ULTRASOUND-GUIDED CORE NEEDLE BIOPSY: - FRAGMENTS OF BENIGN INTRAMAMMARY LYMPH NODE. - FOCAL BACKGROUND BENIGN MAMMARY PARENCHYMA. - NO EVIDENCE OF MALIGNANCY. Pathology results are CONCORDANT with imaging findings, per Dr. Valentino Saxon. Pathology results and recommendations below were discussed with patient by telephone on 01/01/2022. Patient reported biopsy site within normal limits with slight tenderness at the site. Post biopsy care instructions were reviewed, questions were answered and my direct phone number was provided to patient. Patient was instructed to call [facility] if any concerns or questions arise related to the biopsy. RECOMMENDATIONS: 1. Surgical consultation. Request for surgical consultation relayed to Casper Harrison RN at [facility] by Electa Sniff RN on 01/01/2022. 2. Consider genetic consultation given history of sister with breast and colon cancer. Pathology results reported by Electa Sniff RN on 01/01/2022. Electronically Signed   By: Valentino Saxon M.D.  On: 01/01/2022 14:58   Result Date: 01/01/2022 CLINICAL DATA:  Patient presents for ultrasound-guided biopsy multiple RIGHT breast masses EXAM: ULTRASOUND  GUIDED RIGHT BREAST CORE NEEDLE BIOPSY x2 RIGHT BREAST ULTRASOUND-GUIDED ASPIRATION. COMPARISON:  Previous exam(s). PROCEDURE: I met with the patient and we discussed the procedure of ultrasound-guided biopsy, including benefits and alternatives. We discussed the high likelihood of a successful procedure. We discussed the risks of the procedure, including infection, bleeding, tissue injury, clip migration, and inadequate sampling. Informed written consent was given. The usual time-out protocol was performed immediately prior to the procedure. BIOPSY SITE 1: 9:30 4 to 5 cm from the nipple Lesion quadrant: Upper outer quadrant Using sterile technique and 1% lidocaine and 1% lidocaine with epinephrine as local anesthetic, under direct ultrasound visualization, a 14 gauge spring-loaded device was used to perform biopsy of a mass at 9:30 using a lateral approach. At the conclusion of the procedure a RIBBON shaped tissue marker clip was deployed into the biopsy cavity. Follow up 2 view mammogram was performed and dictated separately. ULTRASOUND-GUIDED ASPIRATION SITE A: During setup for today's examination, sonographically identified mass at 9 o'clock 1 cm from the nipple demonstrated an anechoic appearance. As such, ultrasound-guided aspiration was attempted first. Using sterile technique, 1% lidocaine, under direct ultrasound visualization, needle aspiration of a mass at 9 o'clock 1 cm from the nipple was performed. Aspirated fluid has a benign appearance so was discarded. BIOPSY SITE 2: 9 o'clock 4 cm from the nipple Lesion quadrant: Upper outer quadrant Using sterile technique and 1% lidocaine and 1% lidocaine with epinephrine as local anesthetic, under direct ultrasound visualization, a 14 gauge spring-loaded device was used to perform biopsy of a mass at 9 o'clock using a superolateral approach. At the conclusion of the procedure a heart shaped tissue marker clip was deployed into the biopsy cavity. Follow up 2 view  mammogram was performed and dictated separately. IMPRESSION: 1. Ultrasound guided biopsy of a suspicious mass at 9:30. No apparent complications. 2. Ultrasound guided biopsy of an oval mass at 9 o'clock 4 cm from the nipple. No apparent complications. 3. Ultrasound-guided aspiration of a benign cyst at 9 o'clock 1 cm from the nipple. RECOMMENDATIONS: Pending pathology results of biopsy site 1 and 2. Electronically Signed: By: Valentino Saxon M.D. On: 12/31/2021 09:15   [place] RT BREAST BX W LOC DEV EA ADD LESION IMG BX SPEC US GUIDE  Addendum Date: 01/01/2022   ADDENDUM REPORT: 01/01/2022 14:58 ADDENDUM: PATHOLOGY revealed: Site A. BREAST, RIGHT AT 930 O'CLOCK, 4-5 CM FROM THE NIPPLE; ULTRASOUND-GUIDED CORE NEEDLE BIOPSY: - INVASIVE MAMMARY CARCINOMA, WITH TUBULAR FEATURES. Size of invasive carcinoma: 6 mm in this sample. Grade 1. Ductal carcinoma in situ: Present, low-grade Lymphovascular invasion: Not identified. Pathology results are CONCORDANT with imaging findings, per Dr. Valentino Saxon. PATHOLOGY revealed: Site B. BREAST, RIGHT AT 9:00, 4 CM FROM NIPPLE; ULTRASOUND-GUIDED CORE NEEDLE BIOPSY: - FRAGMENTS OF BENIGN INTRAMAMMARY LYMPH NODE. - FOCAL BACKGROUND BENIGN MAMMARY PARENCHYMA. - NO EVIDENCE OF MALIGNANCY. Pathology results are CONCORDANT with imaging findings, per Dr. Valentino Saxon. Pathology results and recommendations below were discussed with patient by telephone on 01/01/2022. Patient reported biopsy site within normal limits with slight tenderness at the site. Post biopsy care instructions were reviewed, questions were answered and my direct phone number was provided to patient. Patient was instructed to call [facility] if any concerns or questions arise related to the biopsy. RECOMMENDATIONS: 1. Surgical consultation. Request for surgical consultation relayed to Casper Harrison RN at [facility] by Electa Sniff RN on 01/01/2022. 2. Consider genetic consultation  given history of sister with breast and colon cancer. Pathology results reported by Electa Sniff RN on 01/01/2022. Electronically Signed   By: Valentino Saxon M.D.   On: 01/01/2022 14:58   Result Date: 01/01/2022 CLINICAL DATA:  Patient presents for ultrasound-guided biopsy multiple RIGHT breast masses EXAM: ULTRASOUND GUIDED RIGHT BREAST CORE NEEDLE BIOPSY x2 RIGHT BREAST ULTRASOUND-GUIDED ASPIRATION. COMPARISON:  Previous exam(s). PROCEDURE: I met with the patient and we discussed the procedure of ultrasound-guided biopsy, including benefits and alternatives. We discussed the high likelihood of a successful procedure. We discussed the risks of the procedure, including infection, bleeding, tissue injury, clip migration, and inadequate sampling. Informed written consent was given. The usual time-out protocol was performed immediately prior to the procedure. BIOPSY SITE 1: 9:30 4 to 5 cm from the nipple Lesion quadrant: Upper outer quadrant Using sterile technique and 1% lidocaine and 1% lidocaine with epinephrine as local anesthetic, under direct ultrasound visualization, a 14 gauge spring-loaded device was used to perform biopsy of a mass at 9:30 using a lateral approach. At the conclusion of the procedure a RIBBON shaped tissue marker clip was deployed into the biopsy cavity. Follow up 2 view mammogram was performed and dictated separately. ULTRASOUND-GUIDED ASPIRATION SITE A: During setup for today's examination, sonographically identified mass at 9 o'clock 1 cm from the nipple demonstrated an anechoic appearance. As such, ultrasound-guided aspiration was attempted first. Using sterile technique, 1% lidocaine, under direct ultrasound visualization, needle aspiration of a mass at 9 o'clock 1 cm from the nipple was performed. Aspirated fluid has a benign appearance so was discarded. BIOPSY SITE 2: 9 o'clock 4 cm from the nipple Lesion quadrant: Upper outer quadrant Using sterile technique and 1% lidocaine and  1% lidocaine with epinephrine as local anesthetic, under direct ultrasound visualization, a 14 gauge spring-loaded device was used to perform biopsy of a mass at 9 o'clock using a superolateral approach. At the conclusion of the procedure a heart shaped tissue marker clip was deployed into the biopsy cavity. Follow up 2 view mammogram was performed and dictated separately. IMPRESSION: 1. Ultrasound guided biopsy of a suspicious mass at 9:30. No apparent complications. 2. Ultrasound guided biopsy of an oval mass at 9 o'clock 4 cm from the nipple. No apparent complications. 3. Ultrasound-guided aspiration of a benign cyst at 9 o'clock 1 cm from the nipple. RECOMMENDATIONS: Pending pathology results of biopsy site 1 and 2. Electronically Signed: By: Valentino Saxon M.D. On: 12/31/2021 09:15   US BREAST ASPIRATION RIGHT  Addendum Date: 01/01/2022   ADDENDUM REPORT: 01/01/2022 14:58 ADDENDUM: PATHOLOGY revealed: Site A. BREAST, RIGHT AT 930 O'CLOCK, 4-5 CM FROM THE NIPPLE; ULTRASOUND-GUIDED CORE NEEDLE BIOPSY: - INVASIVE MAMMARY CARCINOMA, WITH TUBULAR FEATURES. Size of invasive carcinoma: 6 mm in this sample. Grade 1. Ductal carcinoma in situ: Present, low-grade Lymphovascular invasion: Not identified. Pathology results are CONCORDANT with imaging findings, per Dr. Valentino Saxon. PATHOLOGY revealed: Site B. BREAST, RIGHT AT 9:00, 4 CM FROM NIPPLE; ULTRASOUND-GUIDED CORE NEEDLE BIOPSY: - FRAGMENTS OF BENIGN INTRAMAMMARY LYMPH NODE. - FOCAL BACKGROUND BENIGN MAMMARY PARENCHYMA. - NO EVIDENCE OF MALIGNANCY. Pathology results are CONCORDANT with imaging findings, per Dr. Valentino Saxon. Pathology results and recommendations below were discussed with patient by telephone on 01/01/2022. Patient reported biopsy site within normal limits with slight tenderness at the site. Post biopsy care instructions were reviewed, questions were answered and my direct phone number was provided to patient. Patient was  instructed to call [facility] if any concerns or questions arise related to the biopsy. RECOMMENDATIONS: 1. Surgical consultation. Request for surgical consultation relayed to Casper Harrison RN at [facility] by Electa Sniff RN on 01/01/2022. 2. Consider genetic consultation given history of sister with breast and colon cancer. Pathology results reported by Electa Sniff RN on 01/01/2022. Electronically Signed   By: Valentino Saxon M.D.   On: 01/01/2022 14:58   Result Date: 01/01/2022 CLINICAL DATA:  Patient presents for ultrasound-guided biopsy multiple RIGHT breast masses EXAM: ULTRASOUND GUIDED RIGHT BREAST CORE NEEDLE BIOPSY x2 RIGHT BREAST ULTRASOUND-GUIDED ASPIRATION. COMPARISON:  Previous exam(s). PROCEDURE: I met with the patient and we discussed the procedure of ultrasound-guided biopsy, including benefits and alternatives. We discussed the high likelihood of a successful procedure. We discussed the risks of the procedure, including infection, bleeding, tissue injury, clip migration, and inadequate sampling. Informed written consent was given. The usual time-out protocol was performed immediately prior to the procedure. BIOPSY SITE 1: 9:30 4 to 5 cm from the nipple Lesion quadrant: Upper outer quadrant Using sterile technique and 1% lidocaine and 1% lidocaine with epinephrine as local anesthetic, under direct ultrasound visualization, a 14 gauge spring-loaded device was used to perform biopsy of a mass at 9:30 using a lateral approach. At the conclusion of the procedure a RIBBON shaped tissue marker clip was deployed into the biopsy cavity. Follow up 2 view mammogram was performed and dictated separately. ULTRASOUND-GUIDED ASPIRATION SITE A: During setup for today's examination, sonographically identified mass at 9 o'clock 1 cm from the nipple demonstrated an anechoic appearance. As such, ultrasound-guided aspiration was attempted first. Using sterile technique, 1% lidocaine,  under direct ultrasound visualization, needle aspiration of a mass at 9 o'clock 1 cm from the nipple was performed. Aspirated fluid has a benign appearance so was discarded. BIOPSY SITE 2: 9 o'clock 4 cm from the nipple Lesion quadrant: Upper outer quadrant Using sterile technique and 1% lidocaine and 1% lidocaine with epinephrine as local anesthetic, under direct ultrasound visualization, a 14 gauge spring-loaded device was used to perform biopsy of a mass at 9 o'clock using a superolateral approach. At the conclusion of the procedure a heart shaped tissue marker clip was deployed into the biopsy cavity. Follow up 2 view mammogram was performed and dictated separately. IMPRESSION: 1. Ultrasound guided biopsy of a suspicious mass at 9:30. No apparent complications. 2. Ultrasound guided biopsy of an oval mass at 9 o'clock 4 cm from the nipple. No apparent complications. 3. Ultrasound-guided aspiration of a benign cyst at 9 o'clock 1 cm from the nipple. RECOMMENDATIONS: Pending pathology results of biopsy site 1 and 2. Electronically Signed: By: Valentino Saxon M.D. On: 12/31/2021 09:15   MM CLIP PLACEMENT RIGHT  Result Date: 12/31/2021 CLINICAL DATA:  CLIP PLACEMENT EXAM: 3D DIAGNOSTIC RIGHT MAMMOGRAM POST ULTRASOUND BIOPSY COMPARISON:  Previous exam(s). FINDINGS: 3D Mammographic images were obtained following ultrasound guided biopsy of a suspicious mass at 9:30. The RIBBON biopsy marking clip is in expected position at the site of biopsy. 3D Mammographic images were obtained following ultrasound guided biopsy of an oval mass at 9 o'clock. The heart biopsy marking clip is in expected position at the site of biopsy. IMPRESSION: 1. Appropriate positioning of the RIBBON shaped biopsy marking clip at the site of biopsy in the upper outer breast. 2. Appropriate positioning of the heart shaped biopsy marking clip at the site of biopsy in the outer breast. Final Assessment: Post Procedure Mammograms for Marker  Placement Electronically Signed   By: Valentino Saxon M.D.   On: 12/31/2021 09:06  MM DIAG BREAST TOMO BILATERAL  Result Date: 12/17/2021 CLINICAL DATA:  Screening recall for right breast masses and left breast asymmetry. Patient's sister was recently diagnosed with triple negative breast cancer. EXAM: DIGITAL DIAGNOSTIC BILATERAL MAMMOGRAM WITH TOMOSYNTHESIS; ULTRASOUND RIGHT BREAST LIMITED; ULTRASOUND LEFT BREAST LIMITED TECHNIQUE: Bilateral digital diagnostic mammography and breast tomosynthesis was performed.; Targeted ultrasound examination of the right breast was performed; Targeted ultrasound examination of the left breast was performed. COMPARISON:  Previous exam(s). ACR Breast Density Category b: There are scattered areas of fibroglandular density. FINDINGS: Additional tomograms were performed of the bilateral breasts. There is a spiculated mass containing calcifications in the outer right breast measuring 0.7 cm. There is an additional oval circumscribed mass located slightly lateral measuring 0.7 cm. In addition, multiple small subcentimeter oval masses/areas of nodularity are identified in the outer right breast. The initially questioned possible asymmetry in the left breast seen on the MLO view only is felt to resolve on the additional imaging with findings consistent with an area of overlapping fibroglandular tissue. Targeted ultrasound of the outer right breast was performed. There is a shadowing spiculated hypoechoic mass in the right breast at the 9:30 position 4-5 cm from nipple measuring 0.8 x 0.7 x 0.7 cm. This corresponds well with the spiculated mass containing calcifications seen in the right breast at mammography. There is an additional oval hypoechoic mass in the right breast at 9 o'clock 4 cm from nipple measuring 0.6 x 0.4 x 0.6 cm. This demonstrates imaging features suggestive of an intramammary lymph node in corresponds with the additional more lateral mass seen in the outer  right breast. Few small scattered areas of fibrocystic change are identified in the outer right breast however there is an additional irregular hypoechoic mass at the 9 o'clock position 1 cm from the nipple measuring 0.5 x 0.5 x 0.5 cm. No lymphadenopathy seen in the right axilla. Targeted ultrasound of the outer left breast was performed. No suspicious masses or abnormality seen, only heterogeneous fibroglandular tissue identified. IMPRESSION: 1. Suspicious spiculated 0.8 cm mass containing calcifications in the right breast at 9:30 4-5 cm from nipple. 2. Indeterminate 0.6 cm mass in the right breast at 9 o'clock 4 cm from nipple, possibly an intramammary lymph node. 4. Indeterminate 0.5 cm mass in the right breast at 9 o'clock 1 cm from the nipple. RECOMMENDATION: 1. Recommend ultrasound-guided biopsy of the mass in the right breast at the 9:30 position 4-5 cm from nipple. 2. Recommend ultrasound-guided biopsy of the 0.6 cm mass in the right breast at 9 o'clock 4 cm from nipple. 3. Recommend ultrasound-guided biopsy of the 0.5 cm mass in the right breast at 9 o'clock 1 cm from nipple. 4. Depending on biopsy results and treatment plan, bilateral breast MRI with contrast should be considered given nodular/heterogeneous appearance of the breast parenchyma. I have discussed the findings and recommendations with the patient. If applicable, a reminder letter will be sent to the patient regarding the next appointment. BI-RADS CATEGORY  5: Highly suggestive of malignancy. Electronically Signed   By: Everlean Alstrom M.D.   On: 12/17/2021 16:23  US BREAST LTD UNI RIGHT INC AXILLA  Result Date: 12/17/2021 CLINICAL DATA:  Screening recall for right breast masses and left breast asymmetry. Patient's sister was recently diagnosed with triple negative breast cancer. EXAM: DIGITAL DIAGNOSTIC BILATERAL MAMMOGRAM WITH TOMOSYNTHESIS; ULTRASOUND RIGHT BREAST LIMITED; ULTRASOUND LEFT BREAST LIMITED TECHNIQUE: Bilateral digital  diagnostic mammography and breast tomosynthesis was performed.; Targeted ultrasound examination of the right breast was performed; Targeted ultrasound examination of the left breast was performed. COMPARISON:  Previous exam(s). ACR Breast Density Category b: There are scattered areas of fibroglandular density. FINDINGS: Additional tomograms were performed of the bilateral breasts. There is a spiculated mass containing calcifications in the outer right breast measuring 0.7 cm. There is an additional oval circumscribed mass located slightly lateral measuring 0.7 cm. In addition, multiple small subcentimeter oval masses/areas of nodularity are identified in the outer right breast. The initially questioned possible asymmetry in the left breast seen on the MLO view only is felt to resolve on the additional imaging with findings consistent with an area of overlapping fibroglandular tissue. Targeted ultrasound of the outer right breast was performed. There is a shadowing spiculated hypoechoic mass in the right breast at the 9:30 position 4-5 cm from nipple measuring 0.8 x 0.7 x 0.7 cm. This corresponds well with the spiculated mass containing calcifications seen in the right breast at mammography. There is an additional oval hypoechoic mass in the right breast at 9 o'clock 4 cm from nipple measuring 0.6 x 0.4 x 0.6 cm. This demonstrates imaging features suggestive of an intramammary lymph node in corresponds with the additional more lateral mass seen in the outer right breast. Few small scattered areas of fibrocystic change are identified in the outer right breast however there is an additional irregular hypoechoic mass at the 9 o'clock position 1 cm from the nipple measuring 0.5 x 0.5 x 0.5 cm. No lymphadenopathy seen in the right axilla. Targeted ultrasound of the outer left breast was performed. No suspicious masses or abnormality seen, only heterogeneous fibroglandular tissue identified. IMPRESSION: 1. Suspicious  spiculated 0.8 cm mass containing calcifications in the right breast at 9:30 4-5 cm from nipple. 2. Indeterminate 0.6 cm mass in the right breast at 9 o'clock 4 cm from nipple, possibly an intramammary lymph node. 4. Indeterminate 0.5 cm mass in the right breast at 9 o'clock 1 cm from the nipple. RECOMMENDATION: 1. Recommend ultrasound-guided biopsy of the mass in the right breast at the 9:30 position 4-5 cm from nipple. 2. Recommend ultrasound-guided biopsy of the 0.6 cm mass in the right breast at 9 o'clock 4 cm from nipple. 3. Recommend ultrasound-guided biopsy of the 0.5 cm mass in the right breast at 9 o'clock 1 cm from nipple. 4. Depending on biopsy results and treatment plan, bilateral breast MRI with contrast should be considered given nodular/heterogeneous appearance of the breast parenchyma. I have discussed the findings and recommendations with the patient. If applicable, a reminder letter will be sent to the patient regarding the next appointment. BI-RADS CATEGORY  5: Highly suggestive of malignancy. Electronically Signed   By: Everlean Alstrom M.D.   On: 12/17/2021 16:23  US BREAST LTD UNI LEFT INC AXILLA  Result Date: 12/17/2021 CLINICAL DATA:  Screening recall for right breast masses and left breast asymmetry. Patient's sister was recently diagnosed with triple negative breast cancer. EXAM: DIGITAL DIAGNOSTIC BILATERAL MAMMOGRAM WITH TOMOSYNTHESIS; ULTRASOUND RIGHT BREAST LIMITED; ULTRASOUND LEFT BREAST LIMITED TECHNIQUE: Bilateral digital diagnostic mammography and breast tomosynthesis was performed.; Targeted ultrasound examination of the right breast was performed; Targeted ultrasound examination of the left breast was performed. COMPARISON:  Previous exam(s). ACR Breast Density Category b: There are scattered areas of fibroglandular density. FINDINGS: Additional tomograms were performed of the bilateral breasts. There is a spiculated mass containing calcifications in the outer right breast  measuring 0.7 cm.  There is an additional oval circumscribed mass located slightly lateral measuring 0.7 cm. In addition, multiple small subcentimeter oval masses/areas of nodularity are identified in the outer right breast. The initially questioned possible asymmetry in the left breast seen on the MLO view only is felt to resolve on the additional imaging with findings consistent with an area of overlapping fibroglandular tissue. Targeted ultrasound of the outer right breast was performed. There is a shadowing spiculated hypoechoic mass in the right breast at the 9:30 position 4-5 cm from nipple measuring 0.8 x 0.7 x 0.7 cm. This corresponds well with the spiculated mass containing calcifications seen in the right breast at mammography. There is an additional oval hypoechoic mass in the right breast at 9 o'clock 4 cm from nipple measuring 0.6 x 0.4 x 0.6 cm. This demonstrates imaging features suggestive of an intramammary lymph node in corresponds with the additional more lateral mass seen in the outer right breast. Few small scattered areas of fibrocystic change are identified in the outer right breast however there is an additional irregular hypoechoic mass at the 9 o'clock position 1 cm from the nipple measuring 0.5 x 0.5 x 0.5 cm. No lymphadenopathy seen in the right axilla. Targeted ultrasound of the outer left breast was performed. No suspicious masses or abnormality seen, only heterogeneous fibroglandular tissue identified. IMPRESSION: 1. Suspicious spiculated 0.8 cm mass containing calcifications in the right breast at 9:30 4-5 cm from nipple. 2. Indeterminate 0.6 cm mass in the right breast at 9 o'clock 4 cm from nipple, possibly an intramammary lymph node. 4. Indeterminate 0.5 cm mass in the right breast at 9 o'clock 1 cm from the nipple. RECOMMENDATION: 1. Recommend ultrasound-guided biopsy of the mass in the right breast at the 9:30 position 4-5 cm from nipple. 2. Recommend ultrasound-guided biopsy of  the 0.6 cm mass in the right breast at 9 o'clock 4 cm from nipple. 3. Recommend ultrasound-guided biopsy of the 0.5 cm mass in the right breast at 9 o'clock 1 cm from nipple. 4. Depending on biopsy results and treatment plan, bilateral breast MRI with contrast should be considered given nodular/heterogeneous appearance of the breast parenchyma. I have discussed the findings and recommendations with the patient. If applicable, a reminder letter will be sent to the patient regarding the next appointment. BI-RADS CATEGORY  5: Highly suggestive of malignancy. Electronically Signed   By: Everlean Alstrom M.D.   On: 12/17/2021 16:23

## 2022-01-09 ENCOUNTER — Other Ambulatory Visit: Payer: Self-pay | Admitting: General Surgery

## 2022-01-09 ENCOUNTER — Ambulatory Visit: Payer: Self-pay | Admitting: General Surgery

## 2022-01-09 DIAGNOSIS — C50411 Malignant neoplasm of upper-outer quadrant of right female breast: Secondary | ICD-10-CM

## 2022-01-09 NOTE — H&P (Signed)
PATIENT PROFILE: Denise Saunders is a 61 y.o. female who presents to the Clinic for consultation at the request of Dr. Reece Saunders for evaluation of right breast cancer.  PCP:  Denise Scarlet, PA  HISTORY OF PRESENT ILLNESS: Ms. Denise Saunders reports she had her usual screening mammogram.  Screening mammogram shows an area of distortion with possible mass of the right breast.  This led to diagnostic mammography and ultrasound that shows 3 areas of concern.  One of the area is at the 930 o'clock position of an 8 mm spiculated mass, and other area of concern at the 9:00 that looks like a intramammary nodule and another cystic-like lesion.  The cystic-like patient was aspirated completely and looks benign.  The other 2 areas were biopsied. 30 o'clock mass showed invasive mammary carcinoma.  Denied o'clock intramammary nodule shows benign intramammary nodule.  Family history of breast cancer: Sister Family history of other cancers: Father with sinus cancer, sister with colon cancer Menarche: 12 Menopause: 80 Used OCP: Yes Used estrogen and progesterone therapy: No History of Radiation to the chest: No Number of pregnancies: 1 Age of for pregnancy: 38 Previous breast biopsy: None  Her sister had genetic testing which was negative for any genetic predisposition.  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 Vaginal tablet started 2/18      Hypertriglyceridemia 05/29/2014   Overview    Dietary changes started 3/16      Anxiety 05/15/2014   Overview    Failed lexapro. zoloft started  3/16      Primary insomnia 05/15/2014   Overview    Failed trazodone, failed pamelor, doxepin, amitriptyline. remeron started 1/19      Paresthesia of foot, bilateral 05/15/2014   Overview    Will try neurontin again once anxiety and insomnia stable.        GENERAL REVIEW OF SYSTEMS:   General ROS: negative for - chills, fatigue, fever, weight gain or weight loss Allergy and Immunology ROS: negative for - hives  Hematological and Lymphatic ROS: negative for - bleeding problems or bruising, negative for palpable nodes Endocrine ROS: negative for - heat or cold intolerance, hair changes Respiratory ROS: negative for - cough, shortness of breath or wheezing Cardiovascular ROS: no chest pain or palpitations GI ROS: negative for nausea, vomiting, abdominal pain, diarrhea, constipation Musculoskeletal ROS: negative for - joint swelling or muscle pain Neurological ROS: negative for - confusion, syncope Dermatological ROS: negative for pruritus and rash Psychiatric: negative for anxiety, depression, difficulty sleeping and memory loss  MEDICATIONS: Current Outpatient Medications  Medication Sig Dispense Refill   atorvastatin (LIPITOR) 40 MG tablet Take 1 tablet (40 mg total) by mouth once daily 90 tablet 3   busPIRone (BUSPAR) 15 MG tablet TAKE 1 TABLET(15 MG) BY MOUTH TWICE DAILY 180 tablet 3   flash glucose scanning (FREESTYLE LIBRE 14 DAY) reader Use 1 Device as directed 1 each 0   flash glucose sensor (FREESTYLE LIBRE 14 DAY SENSOR) kit USE 1 KIT AS DIRECTED. 6 kit 3   gabapentin (NEURONTIN) 600 MG tablet TAKE  1 TABLET(600 MG) BY MOUTH THREE TIMES DAILY 270 tablet 3   glipiZIDE (GLUCOTROL XL) 10 MG XL tablet Take 2 tablets (20 mg total) by mouth once daily 180 tablet 3   insulin NPH-REGULAR (NOVOLIN 70-30 FLEXPEN U-100) 100 unit/mL (70-30) pen injector 35 units SubQ before breakfast, then 45 units SubQ before dinner 30 mL 11   levothyroxine (SYNTHROID) 88 MCG tablet Take 1 tablet (88  mcg total) by mouth once daily Take on an empty stomach with a glass of water at least 30-60 minutes before breakfast. 90 tablet 3   metFORMIN (GLUCOPHAGE-XR) 500 MG XR tablet Take 2 tablets (1,000 mg total) by mouth daily with dinner 180 tablet 3   pen needle, diabetic 31 gauge x 5/16" needle Use as directed 100 each 12   traZODone (DESYREL) 100 MG tablet TAKE 1 TABLET(100 MG) BY MOUTH AT BEDTIME 90 tablet 3   venlafaxine (EFFEXOR-XR) 150 MG XR capsule Take 1 capsule (150 mg total) by mouth once daily 90 capsule 2   venlafaxine (EFFEXOR-XR) 75 MG XR capsule Take 1 capsule (75 mg total) by mouth once daily 90 capsule 2   tirzepatide (MOUNJARO) 2.5 mg/0.5 mL PnIj Inject 2.5 mg subcutaneously once a week . Increase to next dose of 5 mg if tolerating. Dx: E11.9 Type 2 Diabetes Mellitus (Patient not taking: Reported on 01/09/2022) 2 mL 0   No current facility-administered medications for this visit.    ALLERGIES: Codeine, Estrace [estradiol], Trulicity [dulaglutide], and Victoza [liraglutide]  PAST MEDICAL HISTORY: Past Medical History:  Diagnosis Date   Anxiety    GERD (gastroesophageal reflux disease)    Hypertriglyceridemia 05/29/2014   Hypothyroidism, acquired, autoimmune 07/14/2017   Itch of skin    Type 2 diabetes mellitus without complication, without long-term current use of insulin (CMS-HCC) 01/23/2016    PAST SURGICAL HISTORY: Past Surgical History:  Procedure Laterality Date   CESAREAN SECTION  03/21/1997   COLONOSCOPY N/A 02/13/2010   Dr. Chad Saunders @ [facility] - Hyperplastic Polyp, rpt 10 yrs per RTE   EGD N/A 02/13/2010   Dr. Kennis Saunders @ [facility] - no rpt per RTE   THYROIDECTOMY TOTAL  09/2016   EXTRACTION CATARACT EXTRACAPSULAR W/INSERTION INTRAOCULAR PROSTHESIS Left 09/04/2021   Procedure: Left - EXTRACTION CATARACT EXTRACAPSULAR WITH PHACO WITH INSERTION INTRAOCULAR PROSTHESIS;  Surgeon: Denise Corrigan, MD;  Location: [facility];  Service: Ophthalmology;  Laterality:  Left;   EXTRACTION CATARACT EXTRACAPSULAR W/INSERTION INTRAOCULAR PROSTHESIS Right 09/18/2021   Procedure: Right - EXTRACTION CATARACT EXTRACAPSULAR WITH PHACO WITH INSERTION INTRAOCULAR PROSTHESIS;  Surgeon: Denise Corrigan, MD;  Location: [facility];  Service: Ophthalmology;  Laterality: Right;   EXCISION GANGLION CYST WRIST PRIMARY     TONSILLECTOMY       FAMILY HISTORY: Family History  Problem Relation Age of Onset   Glaucoma Mother    Thyroid disease Mother    Cancer Father        nasal passage and tongue   Myocardial Infarction (Heart attack) Father    Alcohol abuse Paternal Uncle    Alcohol abuse Maternal Grandfather    Alcohol abuse Paternal Grandfather    Macular degeneration Neg Hx      SOCIAL HISTORY: Social History   Socioeconomic History   Marital status: Married  Tobacco Use   Smoking status: Every Day    Packs/day: 1.00    Years: 15.00    Additional pack years: 0.00    Total pack years: 15.00    Types: Cigarettes   Smokeless  tobacco: Never   Tobacco comments:    have quit in 99 , 01, 2010, 2013  Vaping Use   Vaping Use: Never used  Substance and Sexual Activity   Alcohol use: Not Currently    Alcohol/week: 0.0 standard drinks of alcohol   Drug use: No   Sexual activity: Yes    Partners: Male    Birth control/protection: None  Other Topics Concern   Would you please tell us about the people who live in your home, your pets, or anything else important to your social life? No  Social History Narrative   Married, lives with husband and son, born 57. Some college. Scientist, research (medical) at [employer]. No exercise.    Social Determinants of Health   Food Insecurity: No Food Insecurity (01/24/2019)   Hunger Vital Sign    Worried About Running Out of Food in the Last Year: Never true    Ran Out of Food in the Last Year: Never true  Transportation Needs: No Transportation Needs (01/24/2019)   PRAPARE - Hydrologist  (Medical): No    Lack of Transportation (Non-Medical): No    PHYSICAL EXAM: Vitals:   01/09/22 0846  BP: 130/86  Pulse: (!) 122   Body mass index is 25.5 kg/m. Weight: 71.7 kg (158 lb)   GENERAL: Alert, active, oriented x3  HEENT: Pupils equal reactive to light. Extraocular movements are intact. Sclera clear. Palpebral conjunctiva normal red color.Pharynx clear.  NECK: Supple with no palpable mass and no adenopathy.  LUNGS: Sound clear with no rales rhonchi or wheezes.  HEART: Regular rhythm S1 and S2 without murmur.  BREAST: breasts appear normal, no suspicious masses, no skin or nipple changes or axillary nodes.  ABDOMEN: Soft and depressible, nontender with no palpable mass, no hepatomegaly.  EXTREMITIES: Well-developed well-nourished symmetrical with no dependent edema.  NEUROLOGICAL: Awake alert oriented, facial expression symmetrical, moving all extremities.  REVIEW OF DATA: I have reviewed the following data today: Office Visit on 10/30/2021  Component Date Value   Thyroid Stimulating Horm* 10/30/2021 1.03    Thyroxine, Free (FT4) 10/30/2021 1.02    Sodium 10/30/2021 136    Potassium 10/30/2021 4.2    Chloride 10/30/2021 101    Carbon Dioxide (CO2) 10/30/2021 26    Urea Nitrogen (BUN) 10/30/2021 11    Creatinine 10/30/2021 0.7    Glucose 10/30/2021 173 (H)    Calcium 10/30/2021 9.3    AST (Aspartate Aminotran* 10/30/2021 24    ALT (Alanine Aminotransf* 10/30/2021 30    Bilirubin, Total 10/30/2021 0.5    Alk Phos (Alkaline Phosp* 10/30/2021 102    Albumin 10/30/2021 4.2    Protein, Total 10/30/2021 6.8    Anion Gap 10/30/2021 9    BUN/CREA Ratio 10/30/2021 16    Glomerular Filtration Ra* 10/30/2021 98    Cholesterol, Total 10/30/2021 358    LDL Calculated 10/30/2021     HDL 10/30/2021 34    Triglyceride 10/30/2021 835 (H)    Hemoglobin A1C 10/30/2021 8.7 (H)    Average Blood Glucose (C* 10/30/2021 210    LDL Direct 10/30/2021 177       ASSESSMENT: Ms. Ellerbe is a 61 y.o. female presenting for consultation for right breast cancer.    Patient was oriented again about the pathology results. Surgical alternatives were discussed with patient including partial vs total mastectomy. Surgical technique and post operative care was discussed with patient. Risk of surgery was discussed with patient including but not limited  to: wound infection, seroma, hematoma, brachial plexopathy, mondor's disease (thrombosis of small veins of breast), chronic wound pain, breast lymphedema, altered sensation to the nipple and cosmesis among others.   Patient also complaining of an umbilical hernia.  Small supraumbilical hernia with incarcerated fat.  Tender to palpation.  We discussed about repair of umbilical hernia on the same day surgery.  Discussed about surgical repair.  Discussed about risk of surgery that includes bleeding, infection, pain, injury to intestine or adjacent organs, recurrence, among others.  The patient reports she understood and agreed to proceed with plan.  Malignant neoplasm of upper-outer quadrant of right breast in female, estrogen receptor positive  [C50.411, Z17.0]  PLAN: Right breast radiofrequency tagged partial mastectomy with sentinel lymph node biopsy (81157, [id number]) Umbilical hernia repair (55974) CBC, CMP Avoid taking aspirin 5 days before surgery Contact us if you have any concern  Patient verbalized understanding, all questions were answered, and were agreeable with the plan outlined above.     Herbert Pun, MD  Electronically signed by Herbert Pun, MD

## 2022-01-09 NOTE — H&P (View-Only) (Signed)
PATIENT PROFILE: Denise Saunders is a 61 y.o. female who presents to the Clinic for consultation at the request of Dr. Reece Levy for evaluation of right breast cancer.  PCP:  Sherre Scarlet, PA  HISTORY OF PRESENT ILLNESS: Ms. Raider reports she had her usual screening mammogram.  Screening mammogram shows an area of distortion with possible mass of the right breast.  This led to diagnostic mammography and ultrasound that shows 3 areas of concern.  One of the area is at the 930 o'clock position of an 8 mm spiculated mass, and other area of concern at the 9:00 that looks like a intramammary nodule and another cystic-like lesion.  The cystic-like patient was aspirated completely and looks benign.  The other 2 areas were biopsied. 30 o'clock mass showed invasive mammary carcinoma.  Denied o'clock intramammary nodule shows benign intramammary nodule.  Family history of breast cancer: Sister Family history of other cancers: Father with sinus cancer, sister with colon cancer Menarche: 12 Menopause: 57 Used OCP: Yes Used estrogen and progesterone therapy: No History of Radiation to the chest: No Number of pregnancies: 1 Age of for pregnancy: 38 Previous breast biopsy: None  Her sister had genetic testing which was negative for any genetic predisposition.  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 Vaginal tablet started 2/18      Hypertriglyceridemia 05/29/2014   Overview    Dietary changes started 3/16      Anxiety 05/15/2014   Overview    Failed lexapro. zoloft started  3/16      Primary insomnia 05/15/2014   Overview    Failed trazodone, failed pamelor, doxepin, amitriptyline. remeron started 1/19      Paresthesia of foot, bilateral 05/15/2014   Overview    Will try neurontin again once anxiety and insomnia stable.        GENERAL REVIEW OF SYSTEMS:   General ROS: negative for - chills, fatigue, fever, weight gain or weight loss Allergy and Immunology ROS: negative for - hives  Hematological and Lymphatic ROS: negative for - bleeding problems or bruising, negative for palpable nodes Endocrine ROS: negative for - heat or cold intolerance, hair changes Respiratory ROS: negative for - cough, shortness of breath or wheezing Cardiovascular ROS: no chest pain or palpitations GI ROS: negative for nausea, vomiting, abdominal pain, diarrhea, constipation Musculoskeletal ROS: negative for - joint swelling or muscle pain Neurological ROS: negative for - confusion, syncope Dermatological ROS: negative for pruritus and rash Psychiatric: negative for anxiety, depression, difficulty sleeping and memory loss  MEDICATIONS: Current Outpatient Medications  Medication Sig Dispense Refill   atorvastatin (LIPITOR) 40 MG tablet Take 1 tablet (40 mg total) by mouth once daily 90 tablet 3   busPIRone (BUSPAR) 15 MG tablet TAKE 1 TABLET(15 MG) BY MOUTH TWICE DAILY 180 tablet 3   flash glucose scanning (FREESTYLE LIBRE 14 DAY) reader Use 1 Device as directed 1 each 0   flash glucose sensor (FREESTYLE LIBRE 14 DAY SENSOR) kit USE 1 KIT AS DIRECTED. 6 kit 3   gabapentin (NEURONTIN) 600 MG tablet TAKE  1 TABLET(600 MG) BY MOUTH THREE TIMES DAILY 270 tablet 3   glipiZIDE (GLUCOTROL XL) 10 MG XL tablet Take 2 tablets (20 mg total) by mouth once daily 180 tablet 3   insulin NPH-REGULAR (NOVOLIN 70-30 FLEXPEN U-100) 100 unit/mL (70-30) pen injector 35 units SubQ before breakfast, then 45 units SubQ before dinner 30 mL 11   levothyroxine (SYNTHROID) 88 MCG tablet Take 1 tablet (88  mcg total) by mouth once daily Take on an empty stomach with a glass of water at least 30-60 minutes before breakfast. 90 tablet 3   metFORMIN (GLUCOPHAGE-XR) 500 MG XR tablet Take 2 tablets (1,000 mg total) by mouth daily with dinner 180 tablet 3   pen needle, diabetic 31 gauge x 5/16" needle Use as directed 100 each 12   traZODone (DESYREL) 100 MG tablet TAKE 1 TABLET(100 MG) BY MOUTH AT BEDTIME 90 tablet 3   venlafaxine (EFFEXOR-XR) 150 MG XR capsule Take 1 capsule (150 mg total) by mouth once daily 90 capsule 2   venlafaxine (EFFEXOR-XR) 75 MG XR capsule Take 1 capsule (75 mg total) by mouth once daily 90 capsule 2   tirzepatide (MOUNJARO) 2.5 mg/0.5 mL PnIj Inject 2.5 mg subcutaneously once a week . Increase to next dose of 5 mg if tolerating. Dx: E11.9 Type 2 Diabetes Mellitus (Patient not taking: Reported on 01/09/2022) 2 mL 0   No current facility-administered medications for this visit.    ALLERGIES: Codeine, Estrace [estradiol], Trulicity [dulaglutide], and Victoza [liraglutide]  PAST MEDICAL HISTORY: Past Medical History:  Diagnosis Date   Anxiety    GERD (gastroesophageal reflux disease)    Hypertriglyceridemia 05/29/2014   Hypothyroidism, acquired, autoimmune 07/14/2017   Itch of skin    Type 2 diabetes mellitus without complication, without long-term current use of insulin (CMS-HCC) 01/23/2016    PAST SURGICAL HISTORY: Past Surgical History:  Procedure Laterality Date   CESAREAN SECTION  03/21/1997   COLONOSCOPY N/A 02/13/2010   Dr. Chad Cordial @ [place] - Hyperplastic Polyp, rpt 10 yrs per RTE   EGD N/A 02/13/2010   Dr. Kennis Carina @ [facility] - no rpt per RTE   THYROIDECTOMY TOTAL  09/2016   EXTRACTION CATARACT EXTRACAPSULAR W/INSERTION INTRAOCULAR PROSTHESIS Left 09/04/2021   Procedure: Left - EXTRACTION CATARACT EXTRACAPSULAR WITH PHACO WITH INSERTION INTRAOCULAR PROSTHESIS;  Surgeon: Netta Corrigan, MD;  Location: [facility];  Service: Ophthalmology;  Laterality:  Left;   EXTRACTION CATARACT EXTRACAPSULAR W/INSERTION INTRAOCULAR PROSTHESIS Right 09/18/2021   Procedure: Right - EXTRACTION CATARACT EXTRACAPSULAR WITH PHACO WITH INSERTION INTRAOCULAR PROSTHESIS;  Surgeon: Netta Corrigan, MD;  Location: [facility];  Service: Ophthalmology;  Laterality: Right;   EXCISION GANGLION CYST WRIST PRIMARY     TONSILLECTOMY       FAMILY HISTORY: Family History  Problem Relation Age of Onset   Glaucoma Mother    Thyroid disease Mother    Cancer Father        nasal passage and tongue   Myocardial Infarction (Heart attack) Father    Alcohol abuse Paternal Uncle    Alcohol abuse Maternal Grandfather    Alcohol abuse Paternal Grandfather    Macular degeneration Neg Hx      SOCIAL HISTORY: Social History   Socioeconomic History   Marital status: Married  Tobacco Use   Smoking status: Every Day    Packs/day: 1.00    Years: 15.00    Additional pack years: 0.00    Total pack years: 15.00    Types: Cigarettes   Smokeless  tobacco: Never   Tobacco comments:    have quit in 99 , 01, 2010, 2013  Vaping Use   Vaping Use: Never used  Substance and Sexual Activity   Alcohol use: Not Currently    Alcohol/week: 0.0 standard drinks of alcohol   Drug use: No   Sexual activity: Yes    Partners: Male    Birth control/protection: None  Other Topics Concern   Would you please tell us about the people who live in your home, your pets, or anything else important to your social life? No  Social History Narrative   Married, lives with husband and son, born 7. Some college. Scientist, research (medical) at [employer]. No exercise.    Social Determinants of Health   Food Insecurity: No Food Insecurity (01/24/2019)   Hunger Vital Sign    Worried About Running Out of Food in the Last Year: Never true    Ran Out of Food in the Last Year: Never true  Transportation Needs: No Transportation Needs (01/24/2019)   PRAPARE - Hydrologist  (Medical): No    Lack of Transportation (Non-Medical): No    PHYSICAL EXAM: Vitals:   01/09/22 0846  BP: 130/86  Pulse: (!) 122   Body mass index is 25.5 kg/m. Weight: 71.7 kg (158 lb)   GENERAL: Alert, active, oriented x3  HEENT: Pupils equal reactive to light. Extraocular movements are intact. Sclera clear. Palpebral conjunctiva normal red color.Pharynx clear.  NECK: Supple with no palpable mass and no adenopathy.  LUNGS: Sound clear with no rales rhonchi or wheezes.  HEART: Regular rhythm S1 and S2 without murmur.  BREAST: breasts appear normal, no suspicious masses, no skin or nipple changes or axillary nodes.  ABDOMEN: Soft and depressible, nontender with no palpable mass, no hepatomegaly.  EXTREMITIES: Well-developed well-nourished symmetrical with no dependent edema.  NEUROLOGICAL: Awake alert oriented, facial expression symmetrical, moving all extremities.  REVIEW OF DATA: I have reviewed the following data today: Office Visit on 10/30/2021  Component Date Value   Thyroid Stimulating Horm* 10/30/2021 1.03    Thyroxine, Free (FT4) 10/30/2021 1.02    Sodium 10/30/2021 136    Potassium 10/30/2021 4.2    Chloride 10/30/2021 101    Carbon Dioxide (CO2) 10/30/2021 26    Urea Nitrogen (BUN) 10/30/2021 11    Creatinine 10/30/2021 0.7    Glucose 10/30/2021 173 (H)    Calcium 10/30/2021 9.3    AST (Aspartate Aminotran* 10/30/2021 24    ALT (Alanine Aminotransf* 10/30/2021 30    Bilirubin, Total 10/30/2021 0.5    Alk Phos (Alkaline Phosp* 10/30/2021 102    Albumin 10/30/2021 4.2    Protein, Total 10/30/2021 6.8    Anion Gap 10/30/2021 9    BUN/CREA Ratio 10/30/2021 16    Glomerular Filtration Ra* 10/30/2021 98    Cholesterol, Total 10/30/2021 358    LDL Calculated 10/30/2021     HDL 10/30/2021 34    Triglyceride 10/30/2021 835 (H)    Hemoglobin A1C 10/30/2021 8.7 (H)    Average Blood Glucose (C* 10/30/2021 210    LDL Direct 10/30/2021 177       ASSESSMENT: Ms. Nuzzo is a 61 y.o. female presenting for consultation for right breast cancer.    Patient was oriented again about the pathology results. Surgical alternatives were discussed with patient including partial vs total mastectomy. Surgical technique and post operative care was discussed with patient. Risk of surgery was discussed with patient including but not limited  to: wound infection, seroma, hematoma, brachial plexopathy, mondor's disease (thrombosis of small veins of breast), chronic wound pain, breast lymphedema, altered sensation to the nipple and cosmesis among others.   Patient also complaining of an umbilical hernia.  Small supraumbilical hernia with incarcerated fat.  Tender to palpation.  We discussed about repair of umbilical hernia on the same day surgery.  Discussed about surgical repair.  Discussed about risk of surgery that includes bleeding, infection, pain, injury to intestine or adjacent organs, recurrence, among others.  The patient reports she understood and agreed to proceed with plan.  Malignant neoplasm of upper-outer quadrant of right breast in female, estrogen receptor positive  [C50.411, Z17.0]  PLAN: Right breast radiofrequency tagged partial mastectomy with sentinel lymph node biopsy (17471, 59539) Umbilical hernia repair (67289) CBC, CMP Avoid taking aspirin 5 days before surgery Contact us if you have any concern  Patient verbalized understanding, all questions were answered, and were agreeable with the plan outlined above.     Herbert Pun, MD  Electronically signed by Herbert Pun, MD

## 2022-01-10 ENCOUNTER — Encounter: Payer: Self-pay | Admitting: *Deleted

## 2022-01-10 DIAGNOSIS — C50911 Malignant neoplasm of unspecified site of right female breast: Secondary | ICD-10-CM

## 2022-01-10 NOTE — Progress Notes (Signed)
Lumpectomy is scheduled for 11/10.  Appts to see Dr. Baruch Gouty and Dr. Darrall Dears 3 weeks after made for 12/5.  Appt. Details given, no further needs at this time.

## 2022-01-14 ENCOUNTER — Ambulatory Visit
Admission: RE | Admit: 2022-01-14 | Discharge: 2022-01-14 | Disposition: A | Discharge status: Home / Self Care | Payer: BC Managed Care – PPO | Source: Ambulatory Visit | Attending: General Surgery | Admitting: General Surgery

## 2022-01-14 DIAGNOSIS — C50411 Malignant neoplasm of upper-outer quadrant of right female breast: Secondary | ICD-10-CM | POA: Insufficient documentation

## 2022-01-14 DIAGNOSIS — Z170 Estrogen receptor positive status [ER+]: Secondary | ICD-10-CM | POA: Diagnosis present

## 2022-01-14 HISTORY — PX: BREAST BIOPSY: SHX20

## 2022-01-15 ENCOUNTER — Encounter
Admission: RE | Admit: 2022-01-15 | Discharge: 2022-01-15 | Disposition: A | Discharge status: Home / Self Care | Payer: BC Managed Care – PPO | Source: Ambulatory Visit | Attending: General Surgery | Admitting: General Surgery

## 2022-01-15 DIAGNOSIS — Z01810 Encounter for preprocedural cardiovascular examination: Secondary | ICD-10-CM

## 2022-01-15 DIAGNOSIS — Z01818 Encounter for other preprocedural examination: Secondary | ICD-10-CM

## 2022-01-15 HISTORY — DX: Malignant neoplasm of unspecified site of unspecified female breast: C50.919

## 2022-01-15 HISTORY — DX: Other specified disorders of thyroid: E07.89

## 2022-01-15 HISTORY — DX: Hypothyroidism, unspecified: E03.9

## 2022-01-15 NOTE — Patient Instructions (Signed)
Your procedure is scheduled on:01-17-22 Friday Report to the Registration Desk on the 1st floor of the [facility].Then proceed to the Radiology Desk (2nd desk on right) Arrive at 9:30 am  REMEMBER: Instructions that are not followed completely may result in serious medical risk, up to and including death; or upon the discretion of your surgeon and anesthesiologist your surgery may need to be rescheduled.  Do not eat food OR drink any liquids after midnight the night before surgery.  No gum chewing, lozengers or hard candies.  TAKE THESE MEDICATIONS THE MORNING OF SURGERY WITH A SIP OF WATER: -busPIRone (BUSPAR)  -levothyroxine (SYNTHROID)   Stop your metFORMIN (GLUCOPHAGE-XR) NOW (01-15-22)-Last dose was on 01-14-22  Do NOT take any Insulin the morning of your Surgery  One week prior to surgery: Stop Anti-inflammatories (NSAIDS) such as Advil, Aleve, Ibuprofen, Motrin, Naproxen, Naprosyn and Aspirin based products such as Excedrin, Goodys Powder, BC Powder.You may however,  take Tylenol if needed for pain up until the day of surgery.  Stop ANY OVER THE COUNTER supplements/vitamins NOW (01-15-22) until after surgery.  No Alcohol for 24 hours before or after surgery.  No Smoking including e-cigarettes for 24 hours prior to surgery.  No chewable tobacco products for at least 6 hours prior to surgery.  No nicotine patches on the day of surgery.  Do not use any "recreational" drugs for at least a week prior to your surgery.  Please be advised that the combination of cocaine and anesthesia may have negative outcomes, up to and including death. If you test positive for cocaine, your surgery will be cancelled.  On the morning of surgery brush your teeth with toothpaste and water, you may rinse your mouth with mouthwash if you wish. Do not swallow any toothpaste or mouthwash.  Use CHG Soap as directed on instruction sheet.  Do not wear jewelry, make-up, hairpins, clips or nail  polish.  Do not wear lotions, powders, or perfumes.   Do not shave body from the neck down 48 hours prior to surgery just in case you cut yourself which could leave a site for infection.  Also, freshly shaved skin may become irritated if using the CHG soap.  Contact lenses, hearing aids and dentures may not be worn into surgery.  Do not bring valuables to the hospital. [facility] is not responsible for any missing/lost belongings or valuables.   Notify your doctor if there is any change in your medical condition (cold, fever, infection).  Wear comfortable clothing (specific to your surgery type) to the hospital.  After surgery, you can help prevent lung complications by doing breathing exercises.  Take deep breaths and cough every 1-2 hours. Your doctor may order a device called an Incentive Spirometer to help you take deep breaths. When coughing or sneezing, hold a pillow firmly against your incision with both hands. This is called "splinting." Doing this helps protect your incision. It also decreases belly discomfort.  If you are being admitted to the hospital overnight, leave your suitcase in the car. After surgery it may be brought to your room.  If you are being discharged the day of surgery, you will not be allowed to drive home. You will need a responsible adult (18 years or older) to drive you home and stay with you that night.   If you are taking public transportation, you will need to have a responsible adult (18 years or older) with you. Please confirm with your physician that it is acceptable to use  public transportation.   Please call the [facility] at [phone] if you have any questions about these instructions.  Surgery Visitation Policy:  Patients undergoing a surgery or procedure may have two family members or support persons with them as long as the person is not COVID-19 positive or experiencing its symptoms.

## 2022-01-16 ENCOUNTER — Encounter
Admission: RE | Admit: 2022-01-16 | Discharge: 2022-01-16 | Disposition: A | Discharge status: Home / Self Care | Payer: BC Managed Care – PPO | Source: Ambulatory Visit | Attending: General Surgery | Admitting: General Surgery

## 2022-01-16 DIAGNOSIS — E039 Hypothyroidism, unspecified: Secondary | ICD-10-CM | POA: Diagnosis not present

## 2022-01-16 DIAGNOSIS — K219 Gastro-esophageal reflux disease without esophagitis: Secondary | ICD-10-CM | POA: Diagnosis not present

## 2022-01-16 DIAGNOSIS — Z01810 Encounter for preprocedural cardiovascular examination: Secondary | ICD-10-CM | POA: Insufficient documentation

## 2022-01-16 DIAGNOSIS — Z7984 Long term (current) use of oral hypoglycemic drugs: Secondary | ICD-10-CM | POA: Diagnosis not present

## 2022-01-16 DIAGNOSIS — R000 Tachycardia, unspecified: Secondary | ICD-10-CM | POA: Insufficient documentation

## 2022-01-16 DIAGNOSIS — E1165 Type 2 diabetes mellitus with hyperglycemia: Secondary | ICD-10-CM | POA: Diagnosis not present

## 2022-01-16 DIAGNOSIS — Z803 Family history of malignant neoplasm of breast: Secondary | ICD-10-CM | POA: Diagnosis not present

## 2022-01-16 DIAGNOSIS — C50411 Malignant neoplasm of upper-outer quadrant of right female breast: Secondary | ICD-10-CM | POA: Diagnosis present

## 2022-01-16 DIAGNOSIS — K429 Umbilical hernia without obstruction or gangrene: Secondary | ICD-10-CM | POA: Diagnosis not present

## 2022-01-16 DIAGNOSIS — Z800 Family history of malignant neoplasm of digestive organs: Secondary | ICD-10-CM | POA: Diagnosis not present

## 2022-01-16 MED ORDER — FAMOTIDINE 20 MG PO TABS: 20.0000 mg | ORAL_TABLET | Freq: Once | ORAL | Status: AC

## 2022-01-16 MED ORDER — CHLORHEXIDINE GLUCONATE 0.12 % MT SOLN: 15.0000 mL | Freq: Once | OROMUCOSAL | Status: AC

## 2022-01-16 MED ORDER — ORAL CARE MOUTH RINSE: 15.0000 mL | Freq: Once | OROMUCOSAL | Status: AC

## 2022-01-16 MED ORDER — SODIUM CHLORIDE 0.9 % IV SOLN: INTRAVENOUS | Status: DC

## 2022-01-16 MED ORDER — CEFAZOLIN SODIUM-DEXTROSE 2-4 GM/100ML-% IV SOLN
2.0000 g | INTRAVENOUS | Status: AC
  Administered 2022-01-17: 2 g via INTRAVENOUS

## 2022-01-17 ENCOUNTER — Ambulatory Visit: Payer: BC Managed Care – PPO | Admitting: Certified Registered"

## 2022-01-17 ENCOUNTER — Ambulatory Visit
Admission: RE | Admit: 2022-01-17 | Discharge: 2022-01-17 | Disposition: A | Discharge status: Home / Self Care | Payer: BC Managed Care – PPO | Source: Ambulatory Visit | Attending: General Surgery | Admitting: General Surgery

## 2022-01-17 ENCOUNTER — Encounter
Admission: RE | Admit: 2022-01-17 | Discharge: 2022-01-17 | Disposition: A | Discharge status: Home / Self Care | Payer: BC Managed Care – PPO | Source: Ambulatory Visit | Attending: General Surgery | Admitting: General Surgery

## 2022-01-17 ENCOUNTER — Encounter
Admission: RE | Disposition: A | Discharge status: Home / Self Care | Payer: Self-pay | Source: Home / Self Care | Attending: General Surgery

## 2022-01-17 ENCOUNTER — Ambulatory Visit
Admission: RE | Admit: 2022-01-17 | Discharge: 2022-01-17 | Disposition: A | Discharge status: Home / Self Care | Payer: BC Managed Care – PPO | Attending: General Surgery | Admitting: General Surgery

## 2022-01-17 ENCOUNTER — Encounter: Payer: Self-pay | Admitting: General Surgery

## 2022-01-17 ENCOUNTER — Other Ambulatory Visit: Payer: Self-pay

## 2022-01-17 DIAGNOSIS — Z7984 Long term (current) use of oral hypoglycemic drugs: Secondary | ICD-10-CM | POA: Insufficient documentation

## 2022-01-17 DIAGNOSIS — C50411 Malignant neoplasm of upper-outer quadrant of right female breast: Secondary | ICD-10-CM | POA: Insufficient documentation

## 2022-01-17 DIAGNOSIS — E1165 Type 2 diabetes mellitus with hyperglycemia: Secondary | ICD-10-CM | POA: Insufficient documentation

## 2022-01-17 DIAGNOSIS — Z01818 Encounter for other preprocedural examination: Secondary | ICD-10-CM

## 2022-01-17 DIAGNOSIS — K429 Umbilical hernia without obstruction or gangrene: Secondary | ICD-10-CM | POA: Insufficient documentation

## 2022-01-17 DIAGNOSIS — E039 Hypothyroidism, unspecified: Secondary | ICD-10-CM | POA: Insufficient documentation

## 2022-01-17 DIAGNOSIS — K219 Gastro-esophageal reflux disease without esophagitis: Secondary | ICD-10-CM | POA: Insufficient documentation

## 2022-01-17 DIAGNOSIS — Z803 Family history of malignant neoplasm of breast: Secondary | ICD-10-CM | POA: Insufficient documentation

## 2022-01-17 DIAGNOSIS — Z800 Family history of malignant neoplasm of digestive organs: Secondary | ICD-10-CM | POA: Insufficient documentation

## 2022-01-17 HISTORY — PX: UMBILICAL HERNIA REPAIR: SHX196

## 2022-01-17 HISTORY — PX: PART MASTECTOMY,RADIO FREQUENCY LOCALIZER,AXILLARY SENTINEL NODE BIOPSY: SHX6901

## 2022-01-17 LAB — GLUCOSE, CAPILLARY
Glucose-Capillary: 218 mg/dL — ABNORMAL HIGH (ref 70–99)
Glucose-Capillary: 231 mg/dL — ABNORMAL HIGH (ref 70–99)

## 2022-01-17 SURGERY — PART MASTECTOMY,RADIO FREQUENCY LOCALIZER,AXILLARY SENTINEL NODE BIOPSY
Anesthesia: General | Site: Abdomen | Laterality: Right

## 2022-01-17 MED ORDER — CEFAZOLIN SODIUM-DEXTROSE 2-4 GM/100ML-% IV SOLN
INTRAVENOUS | Status: AC
  Filled 2022-01-17: qty 100

## 2022-01-17 MED ORDER — DEXAMETHASONE SODIUM PHOSPHATE 10 MG/ML IJ SOLN
INTRAMUSCULAR | Status: DC | PRN
  Administered 2022-01-17: 5 mg via INTRAVENOUS

## 2022-01-17 MED ORDER — BUPIVACAINE-EPINEPHRINE (PF) 0.5% -1:200000 IJ SOLN
INTRAMUSCULAR | Status: AC
  Filled 2022-01-17: qty 30

## 2022-01-17 MED ORDER — BUPIVACAINE-EPINEPHRINE (PF) 0.5% -1:200000 IJ SOLN
INTRAMUSCULAR | Status: DC | PRN
  Administered 2022-01-17: 6 mL via PERINEURAL
  Administered 2022-01-17: 24 mL

## 2022-01-17 MED ORDER — PROPOFOL 10 MG/ML IV BOLUS
INTRAVENOUS | Status: DC | PRN
  Administered 2022-01-17: 30 mg via INTRAVENOUS
  Administered 2022-01-17: 140 mg via INTRAVENOUS

## 2022-01-17 MED ORDER — TECHNETIUM TC 99M TILMANOCEPT KIT
1.0500 | PACK | Freq: Once | INTRAVENOUS | Status: AC | PRN
  Administered 2022-01-17: 1.05 via INTRADERMAL

## 2022-01-17 MED ORDER — ACETAMINOPHEN 10 MG/ML IV SOLN
INTRAVENOUS | Status: DC | PRN
  Administered 2022-01-17: 1000 mg via INTRAVENOUS

## 2022-01-17 MED ORDER — METHYLENE BLUE 1 % INJ SOLN
INTRAVENOUS | Status: AC
  Filled 2022-01-17: qty 10

## 2022-01-17 MED ORDER — FENTANYL CITRATE (PF) 100 MCG/2ML IJ SOLN
INTRAMUSCULAR | Status: DC | PRN
  Administered 2022-01-17: 25 ug via INTRAVENOUS
  Administered 2022-01-17: 75 ug via INTRAVENOUS

## 2022-01-17 MED ORDER — PHENYLEPHRINE HCL (PRESSORS) 10 MG/ML IV SOLN
INTRAVENOUS | Status: DC | PRN
  Administered 2022-01-17 (×2): 80 ug via INTRAVENOUS

## 2022-01-17 MED ORDER — MIDAZOLAM HCL 2 MG/2ML IJ SOLN
INTRAMUSCULAR | Status: AC
  Filled 2022-01-17: qty 2

## 2022-01-17 MED ORDER — LIDOCAINE HCL (CARDIAC) PF 100 MG/5ML IV SOSY
PREFILLED_SYRINGE | INTRAVENOUS | Status: DC | PRN
  Administered 2022-01-17: 100 mg via INTRAVENOUS

## 2022-01-17 MED ORDER — DEXMEDETOMIDINE HCL IN NACL 200 MCG/50ML IV SOLN
INTRAVENOUS | Status: DC | PRN
  Administered 2022-01-17 (×2): 8 ug via INTRAVENOUS

## 2022-01-17 MED ORDER — CHLORHEXIDINE GLUCONATE 0.12 % MT SOLN
OROMUCOSAL | Status: AC
  Administered 2022-01-17: 15 mL via OROMUCOSAL
  Filled 2022-01-17: qty 15

## 2022-01-17 MED ORDER — FAMOTIDINE 20 MG PO TABS
ORAL_TABLET | ORAL | Status: AC
  Administered 2022-01-17: 20 mg via ORAL
  Filled 2022-01-17: qty 1

## 2022-01-17 MED ORDER — STERILE WATER FOR IRRIGATION IR SOLN
Status: DC | PRN
  Administered 2022-01-17: 500 mL

## 2022-01-17 MED ORDER — PROPOFOL 10 MG/ML IV BOLUS
INTRAVENOUS | Status: AC
  Filled 2022-01-17: qty 20

## 2022-01-17 MED ORDER — FENTANYL CITRATE (PF) 100 MCG/2ML IJ SOLN: 25.0000 ug | INTRAMUSCULAR | Status: DC | PRN

## 2022-01-17 MED ORDER — ONDANSETRON HCL 4 MG/2ML IJ SOLN
INTRAMUSCULAR | Status: AC
  Filled 2022-01-17: qty 2

## 2022-01-17 MED ORDER — MIDAZOLAM HCL 2 MG/2ML IJ SOLN
INTRAMUSCULAR | Status: DC | PRN
  Administered 2022-01-17: 2 mg via INTRAVENOUS

## 2022-01-17 MED ORDER — FENTANYL CITRATE (PF) 100 MCG/2ML IJ SOLN
INTRAMUSCULAR | Status: AC
  Filled 2022-01-17: qty 2

## 2022-01-17 MED ORDER — ACETAMINOPHEN 10 MG/ML IV SOLN
INTRAVENOUS | Status: AC
  Filled 2022-01-17: qty 100

## 2022-01-17 MED ORDER — ONDANSETRON HCL 4 MG/2ML IJ SOLN
INTRAMUSCULAR | Status: DC | PRN
  Administered 2022-01-17: 4 mg via INTRAVENOUS

## 2022-01-17 MED ORDER — LIDOCAINE HCL (PF) 2 % IJ SOLN
INTRAMUSCULAR | Status: AC
  Filled 2022-01-17: qty 5

## 2022-01-17 MED ORDER — HYDROCODONE-ACETAMINOPHEN 5-325 MG PO TABS: 1.0000 | ORAL_TABLET | ORAL | 0 refills | Status: AC | PRN

## 2022-01-17 SURGICAL SUPPLY — 51 items
ADH SKN CLS APL DERMABOND .7 (GAUZE/BANDAGES/DRESSINGS) ×4
APL PRP STRL LF DISP 70% ISPRP (MISCELLANEOUS) ×2
BINDER BREAST LRG (GAUZE/BANDAGES/DRESSINGS) IMPLANT
BLADE SURG 15 STRL LF DISP TIS (BLADE) ×4 IMPLANT
BLADE SURG 15 STRL SS (BLADE) ×4
CHLORAPREP W/TINT 26 (MISCELLANEOUS) ×2 IMPLANT
CNTNR SPEC 2.5X3XGRAD LEK (MISCELLANEOUS)
CONT SPEC 4OZ STER OR WHT (MISCELLANEOUS)
CONT SPEC 4OZ STRL OR WHT (MISCELLANEOUS)
CONTAINER SPEC 2.5X3XGRAD LEK (MISCELLANEOUS) IMPLANT
COVER PROBE GAMMA FINDER SLV (MISCELLANEOUS) ×2 IMPLANT
DERMABOND ADVANCED .7 DNX12 (GAUZE/BANDAGES/DRESSINGS) IMPLANT
DEVICE DUBIN SPECIMEN MAMMOGRA (MISCELLANEOUS) ×2 IMPLANT
DRAPE LAPAROTOMY 77X122 PED (DRAPES) ×2 IMPLANT
DRAPE LAPAROTOMY TRNSV 106X77 (MISCELLANEOUS) ×2 IMPLANT
DRSG GAUZE FLUFF 36X18 (GAUZE/BANDAGES/DRESSINGS) IMPLANT
ELECT CAUTERY BLADE 6.4 (BLADE) ×2 IMPLANT
ELECT REM PT RETURN 9FT ADLT (ELECTROSURGICAL) ×2
ELECTRODE REM PT RTRN 9FT ADLT (ELECTROSURGICAL) ×2 IMPLANT
GAUZE 4X4 16PLY ~~LOC~~+RFID DBL (SPONGE) ×2 IMPLANT
GLOVE BIO SURGEON STRL SZ 6.5 (GLOVE) ×2 IMPLANT
GLOVE BIOGEL PI IND STRL 6.5 (GLOVE) ×2 IMPLANT
GOWN STRL REUS W/ TWL LRG LVL3 (GOWN DISPOSABLE) ×6 IMPLANT
GOWN STRL REUS W/TWL LRG LVL3 (GOWN DISPOSABLE) ×6
KIT MARKER MARGIN INK (KITS) IMPLANT
KIT TURNOVER KIT A (KITS) ×2 IMPLANT
LABEL OR SOLS (LABEL) ×2 IMPLANT
MANIFOLD NEPTUNE II (INSTRUMENTS) ×2 IMPLANT
MARKER MARGIN CORRECT CLIP (MARKER) IMPLANT
NDL HYPO 25X1 1.5 SAFETY (NEEDLE) ×2 IMPLANT
NEEDLE HYPO 22GX1.5 SAFETY (NEEDLE) ×2 IMPLANT
NEEDLE HYPO 25X1 1.5 SAFETY (NEEDLE) ×2 IMPLANT
NS IRRIG 500ML POUR BTL (IV SOLUTION) ×2 IMPLANT
PACK BASIN MINOR ARMC (MISCELLANEOUS) ×2 IMPLANT
PENCIL SMOKE EVACUATOR (MISCELLANEOUS) IMPLANT
SET LOCALIZER 20 PROBE US (MISCELLANEOUS) ×2 IMPLANT
SUT ETHIBOND 0 (SUTURE) IMPLANT
SUT MNCRL 4-0 (SUTURE) ×4
SUT MNCRL 4-0 27XMFL (SUTURE) ×4
SUT PROLENE 0 CT 2 (SUTURE) ×4 IMPLANT
SUT SILK 2 0 SH (SUTURE) ×2 IMPLANT
SUT VIC AB 2-0 SH 27 (SUTURE) ×6
SUT VIC AB 2-0 SH 27XBRD (SUTURE) ×4 IMPLANT
SUT VIC AB 3-0 SH 27 (SUTURE) ×8
SUT VIC AB 3-0 SH 27X BRD (SUTURE) ×4 IMPLANT
SUTURE MNCRL 4-0 27XMF (SUTURE) ×4 IMPLANT
SYR 10ML LL (SYRINGE) ×4 IMPLANT
SYR BULB IRRIG 60ML STRL (SYRINGE) ×2 IMPLANT
TRAP FLUID SMOKE EVACUATOR (MISCELLANEOUS) ×2 IMPLANT
TRAP NEPTUNE SPECIMEN COLLECT (MISCELLANEOUS) ×2 IMPLANT
WATER STERILE IRR 500ML POUR (IV SOLUTION) ×2 IMPLANT

## 2022-01-17 NOTE — Transfer of Care (Signed)
Immediate Anesthesia Transfer of Care Note  Patient: Denise Saunders  Procedure(s) Performed: PART [place] NODE BIOPSY (Right) HERNIA REPAIR UMBILICAL ADULT (Abdomen)  Patient Location: PACU  Anesthesia Type:General  Level of Consciousness: drowsy  Airway & Oxygen Therapy: Patient Spontanous Breathing and Patient connected to face mask oxygen  Post-op Assessment: Report given to RN and Post -op Vital signs reviewed and stable  Post vital signs: Reviewed and stable  Last Vitals:  Vitals Value Taken Time  BP 106/66 01/17/22 1347  Temp 36.1 1346  Pulse 102 01/17/22 1356  Resp 17 01/17/22 1356  SpO2 99 % 01/17/22 1356  Vitals shown include unvalidated device data.  Last Pain:  Vitals:   01/17/22 1023  TempSrc: Tympanic  PainSc: 0-No pain         Complications: No notable events documented.

## 2022-01-17 NOTE — Anesthesia Procedure Notes (Signed)
Procedure Name: LMA Insertion Date/Time: 01/17/2022 11:04 AM  Performed by: Cammie Sickle, CRNAPre-anesthesia Checklist: Patient identified, Emergency Drugs available, Suction available and Patient being monitored Patient Re-evaluated:Patient Re-evaluated prior to induction Oxygen Delivery Method: Circle system utilized Preoxygenation: Pre-oxygenation with 100% oxygen Induction Type: IV induction LMA: LMA inserted LMA Size: 4.0 Number of attempts: 1 Placement Confirmation: positive ETCO2 and breath sounds checked- equal and bilateral Tube secured with: Tape Dental Injury: Teeth and Oropharynx as per pre-operative assessment

## 2022-01-17 NOTE — Interval H&P Note (Signed)
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  The various methods of treatment have been discussed with the patient and family. After consideration of risks, benefits and other options for treatment, the patient has consented to  Procedure(s): PART [employer] [facility] (Right) HERNIA REPAIR UMBILICAL ADULT (N/A) as a surgical intervention.  The patient's history has been reviewed, patient examined, no change in status, stable for surgery.  I have reviewed the patient's chart and labs.  Questions were answered to the patient's satisfaction.     Herbert Pun

## 2022-01-17 NOTE — Anesthesia Procedure Notes (Signed)
Procedure Name: LMA Insertion Date/Time: 01/17/2022 11:04 AM  Performed by: Cammie Sickle, CRNAPre-anesthesia Checklist: Patient identified, Patient being monitored, Timeout performed, Emergency Drugs available and Suction available Patient Re-evaluated:Patient Re-evaluated prior to induction Oxygen Delivery Method: Circle system utilized Preoxygenation: Pre-oxygenation with 100% oxygen Induction Type: IV induction Ventilation: Mask ventilation without difficulty LMA: LMA inserted LMA Size: 4.0 Tube type: Oral Number of attempts: 1 Placement Confirmation: positive ETCO2 and breath sounds checked- equal and bilateral Tube secured with: Tape Dental Injury: Teeth and Oropharynx as per pre-operative assessment

## 2022-01-17 NOTE — Anesthesia Preprocedure Evaluation (Signed)
Anesthesia Evaluation  Patient identified by MRN, date of birth, ID band Patient awake    Reviewed: Allergy & Precautions, H&P , NPO status , Patient's Chart, lab work & pertinent test results, reviewed documented beta blocker date and time   History of Anesthesia Complications Negative for: history of anesthetic complications  Airway Mallampati: II  TM Distance: >3 FB Neck ROM: full    Dental  (+) Teeth Intact, Caps, Dental Advidsory Given   Pulmonary neg pulmonary ROS, neg shortness of breath, neg COPD, neg recent URI, former smoker   Pulmonary exam normal        Cardiovascular Exercise Tolerance: Good negative cardio ROS Normal cardiovascular exam Rhythm:regular Rate:Normal     Neuro/Psych negative neurological ROS  negative psych ROS   GI/Hepatic negative GI ROS, Neg liver ROS,GERD  Medicated,,  Endo/Other  diabetes, Poorly Controlled, Type 2, Oral Hypoglycemic AgentsHypothyroidism    Renal/GU negative Renal ROS  negative genitourinary   Musculoskeletal   Abdominal   Peds  Hematology negative hematology ROS (+)   Anesthesia Other Findings Past Medical History: No date: Diabetes mellitus without complication (HCC) No date: Family history of adverse reaction to anesthesia     Comment:  MOM-N/V AND IS HARD TO WAKE No date: GERD (gastroesophageal reflux disease)     Comment:  [place] 2016: Pneumonia Past Surgical History: No date: CESAREAN SECTION   Reproductive/Obstetrics negative OB ROS                             Anesthesia Physical Anesthesia Plan  ASA: 2  Anesthesia Plan: General ETT   Post-op Pain Management:    Induction: Intravenous  PONV Risk Score and Plan: 4 or greater and Ondansetron, Dexamethasone, Midazolam, Treatment may vary due to age or medical condition and Promethazine  Airway Management Planned: LMA  Additional Equipment:   Intra-op Plan:    Post-operative Plan: Extubation in OR  Informed Consent: I have reviewed the patients History and Physical, chart, labs and discussed the procedure including the risks, benefits and alternatives for the proposed anesthesia with the patient or authorized representative who has indicated his/her understanding and acceptance.     Dental Advisory Given  Plan Discussed with: CRNA  Anesthesia Plan Comments:         Anesthesia Quick Evaluation

## 2022-01-17 NOTE — Discharge Instructions (Addendum)
  Diet: Resume home heart healthy regular diet.   Activity: No heavy lifting >20 pounds (children, pets, laundry, garbage) or strenuous activity until follow-up, but light activity and walking are encouraged. Do not drive or drink alcohol if taking narcotic pain medications.  Wound care: May shower with soapy water and pat dry (do not rub incisions), but no baths or submerging incision underwater until follow-up. (no swimming)   Medications: Resume all home medications. For mild to moderate pain: acetaminophen (Tylenol) ***or ibuprofen (if no kidney disease). Combining Tylenol with alcohol can substantially increase your risk of causing liver disease. Narcotic pain medications, if prescribed, can be used for severe pain, though may cause nausea, constipation, and drowsiness. Do not combine Tylenol and Norco within a 6 hour period as Norco contains Tylenol. If you do not need the narcotic pain medication, you do not need to fill the prescription.  Call office ([phone]) at any time if any questions, worsening pain, fevers/chills, bleeding, drainage from incision site, or other concerns.   AMBULATORY SURGERY  DISCHARGE INSTRUCTIONS   The drugs that you were given will stay in your system until tomorrow so for the next 24 hours you should not:  Drive an automobile Make any legal decisions Drink any alcoholic beverage   You may resume regular meals tomorrow.  Today it is better to start with liquids and gradually work up to solid foods.  You may eat anything you prefer, but it is better to start with liquids, then soup and crackers, and gradually work up to solid foods.   Please notify your doctor immediately if you have any unusual bleeding, trouble breathing, redness and pain at the surgery site, drainage, fever, or pain not relieved by medication.    Additional Instructions:        Please contact your physician with any problems or Same Day Surgery at [phone], Monday  through Friday 6 am to 4 pm, or [facility] Main number at [phone].  

## 2022-01-17 NOTE — Op Note (Signed)
Preoperative diagnosis: Right breast carcinoma.                                           Umbilical hernia  Postoperative diagnosis: Same.   Procedure: Right radiofrequency tag-localized partial mastectomy.                      Right Axillary Sentinel Lymph node biopsy                     Umbilical hernia repair  Anesthesia: GETA  Surgeon: Dr. Windell Moment  Wound Classification: Clean  Indications: Patient is a 61 y.o. female with a nonpalpable right breast mass noted on mammography with core biopsy demonstrating invasive mammary carcinoma requires radiofrequency tag-localized partial mastectomy for treatment with sentinel lymph node biopsy. Also with symptomatic umbilical hernia that was repair to improve pain and avoid complications   Findings: 1. Specimen mammography shows marker and tag on specimen 2. Pathology call refers gross examination of margins was 4 mm from anterior margin 3. No other palpable mass or lymph node identified. 4. 1 cm incarcerated umbilical hernia    Description of procedure:  The patient was brought to the operating room and general anesthesia was induced. A time-out was completed verifying correct patient, procedure, site, positioning, and implant(s) and/or special equipment prior to beginning this procedure. Antibiotics were administered prior to making the incision. The anterior abdominal wall was prepped and draped in the standard sterile fashion. A infra umbilical incision was made. The incision was deepened to the fascia. The hernia sac was then identified and dissected free. The peritoneum of the sac was dissected from the umbilical stalk and able to be reduced easily. The fascia was carefully palpated no additional defects were identified. The hernia defect was closed with interrupted #0 Ethibond suture on midline. The umbilical stalk was fixed to fascia with 2-0 Vicryl and the skin was closed with subcuticular sutures of Monocryl 4-0. Wound covered with  Dermabond.    Preoperative radiofrequency tag localization was performed by radiology. In the nuclear medicine suite, the subareolar region was injected with Tc-99 sulfur colloid. Localization studies were reviewed. The patient was redraped to prepare for right partial mastectomy and sentinel lymph node biopsy.   By comparing the localization studies and interrogation with Localizer device, the probable trajectory and location of the mass was visualized. A circumareolar skin incision was planned in such a way as to minimize the amount of dissection to reach the mass.  The skin incision was made. Flaps were raised and the location of the tag was confirmed with Localizer device confirmed. A 2-0 silk figure-of-eight stay suture was placed and used for retraction. Dissection was then taken down circumferentially, taking care to include the entire localizing tag and a wide margin of grossly normal tissue. The specimen and entire localizing tag were removed. The specimen was oriented and sent to radiology with the localization studies. Confirmation was received that the entire target lesion had been resected. The wound was irrigated. Hemostasis was checked. The wound was closed with interrupted sutures of 3-0 Vicryl and a subcuticular suture of Monocryl 3-0. No attempt was made to close the dead space.   A hand-held gamma probe was used to identify the location of the hottest spot in the axilla. An incision was made around the caudal axillary hairline. Dissection was carried down  until subdermal facias was advanced. The probe was placed and again, the point of maximal count was found. Dissection continue until nodule was identified. The probe was placed in contact with the node. The node was excised in its entirety.  An additional hot spot was detected and the node was excised in similar fashion. No additional hot spots were identified. No clinically abnormal nodes were palpated. The procedure was terminated.  Hemostasis was achieved and the wound closed in layers with deep interrupted 3-0 Vicryl and skin was closed with subcuticular suture of Monocryl 3-0.  The patient tolerated the procedure well and was taken to the postanesthesia care unit in stable condition.   Sentinel Node Biopsy Synoptic Operative Report  Operation performed with curative intent:Yes  Tracer(s) used to identify sentinel nodes in the upfront surgery (non-neoadjuvant) setting (select all that apply):Radioactive Tracer  Tracer(s) used to identify sentinel nodes in the neoadjuvant setting (select all that apply):N/A  All nodes (colored or non-colored) present at the end of a dye-filled lymphatic channel were removed:N/A  All significantly radioactive nodes were removed:Yes  All palpable suspicious nodes were removed:N/A  Biopsy-proven positive nodes marked with clips prior to chemotherapy were identified and removed:N/A   Specimen: Right Breast mass                     Sentinel Lymph nodes #1, #2  Complications: None  Estimated Blood Loss: 25 mL

## 2022-01-20 ENCOUNTER — Encounter: Payer: Self-pay | Admitting: *Deleted

## 2022-01-20 ENCOUNTER — Encounter: Payer: Self-pay | Admitting: General Surgery

## 2022-01-20 NOTE — Progress Notes (Signed)
Denise Saunders had a couple questions about follow up appt. With Dr. Peyton Najjar and starting radiation.   Questions answered to her satisfaction, she is doing well at this time post surgery.

## 2022-01-21 ENCOUNTER — Other Ambulatory Visit: Payer: Self-pay | Admitting: Pathology

## 2022-01-21 LAB — SURGICAL PATHOLOGY

## 2022-01-22 ENCOUNTER — Encounter: Payer: Self-pay | Admitting: *Deleted

## 2022-01-22 NOTE — Progress Notes (Signed)
Oncotype Dx submitted online on Pathology [phone], order ID  [id number]

## 2022-01-27 NOTE — Anesthesia Postprocedure Evaluation (Signed)
Anesthesia Post Note  Patient: Denise Saunders  Procedure(s) Performed: PART [place] SENTINEL NODE BIOPSY (Right) HERNIA REPAIR UMBILICAL ADULT (Abdomen)  Patient location during evaluation: PACU Anesthesia Type: General Level of consciousness: awake and alert Pain management: pain level controlled Vital Signs Assessment: post-procedure vital signs reviewed and stable Respiratory status: spontaneous breathing, nonlabored ventilation, respiratory function stable and patient connected to nasal cannula oxygen Cardiovascular status: blood pressure returned to baseline and stable Postop Assessment: no apparent nausea or vomiting Anesthetic complications: no   No notable events documented.   Last Vitals:  Vitals:   01/17/22 1445 01/17/22 1455  BP: 97/65 111/72  Pulse: (!) 115 (!) 112  Resp: 15   Temp: 36.6 C 36.8 C  SpO2: 94% 93%    Last Pain:  Vitals:   01/18/22 1310  TempSrc:   PainSc: 0-No pain                 Martha Clan

## 2022-01-28 ENCOUNTER — Encounter: Payer: Self-pay | Admitting: Licensed Clinical Social Worker

## 2022-01-28 ENCOUNTER — Inpatient Hospital Stay: Payer: BC Managed Care – PPO | Attending: Internal Medicine | Admitting: Licensed Clinical Social Worker

## 2022-01-28 ENCOUNTER — Inpatient Hospital Stay: Payer: BC Managed Care – PPO

## 2022-01-28 DIAGNOSIS — C50911 Malignant neoplasm of unspecified site of right female breast: Secondary | ICD-10-CM

## 2022-01-28 DIAGNOSIS — Z803 Family history of malignant neoplasm of breast: Secondary | ICD-10-CM

## 2022-01-28 DIAGNOSIS — Z170 Estrogen receptor positive status [ER+]: Secondary | ICD-10-CM

## 2022-01-28 NOTE — Progress Notes (Signed)
REFERRING PROVIDER: Jane Canary, MD [place] [employer],  [place] [id number]  PRIMARY PROVIDER:  Sherre Scarlet, PA-C  PRIMARY REASON FOR VISIT:  1. Malignant neoplasm of right breast in female, estrogen receptor positive, unspecified site of breast ([place])   2. Family history of breast cancer      HISTORY OF PRESENT ILLNESS:   Denise Saunders, a 61 y.o. female, was seen for a [facility] cancer genetics consultation at the request of Dr. Darrall Dears due to a personal and family history of breast cancer.  Denise Saunders presents to clinic today to discuss the possibility of a hereditary predisposition to cancer, genetic testing, and to further clarify her future cancer risks, as well as potential cancer risks for family members.   In 2023, at the age of 5, Denise Saunders was diagnosed with invasive ductal carcinoma of the right breast, ER/PR+ HER2-. The treatment plan includes lumpectomy, completed 11/10 and adjuvant radiation and adjuvant antiestrogen therapy.  CANCER HISTORY:  Oncology History  Breast cancer, right ([place])  11/28/2021 Mammogram   Screening mammogram FINDINGS: In the right breast possible masses in the outer breast require further evaluation.   In the left breast possible asymmetry in the upper breast in the oblique projection requires further evaluation   12/17/2021 Mammogram   Diagnostic mammogram and US IMPRESSION: 1. Suspicious spiculated 0.8 cm mass containing calcifications in the right breast at 9:30 4-5 cm from nipple.   2. Indeterminate 0.6 cm mass in the right breast at 9 o'clock 4 cm from nipple, possibly an intramammary lymph node.   4. Indeterminate 0.5 cm mass in the right breast at 9 o'clock 1 cm from the nipple.  [place] of bilateral axilla negative   12/31/2021 Pathology Results   DIAGNOSIS:  A. BREAST, RIGHT AT 930 O'CLOCK, 4-5 CM FROM THE NIPPLE;  ULTRASOUND-GUIDED CORE NEEDLE BIOPSY:  - INVASIVE MAMMARY CARCINOMA, WITH TUBULAR FEATURES.    Size of invasive carcinoma: 6 mm in this sample  Histologic grade of invasive carcinoma: Grade 1                       Glandular/tubular differentiation score: 1                       Nuclear pleomorphism score: 1                       Mitotic rate score: 1                       Total score: 3  Ductal carcinoma in situ: Present, low-grade  Lymphovascular invasion: Not identified   B. BREAST, RIGHT AT 9:00, 4 CM FROM NIPPLE; ULTRASOUND-GUIDED CORE  NEEDLE BIOPSY:  - FRAGMENTS OF BENIGN INTRAMAMMARY LYMPH NODE.  - FOCAL BACKGROUND BENIGN MAMMARY PARENCHYMA.  - NO EVIDENCE OF MALIGNANCY.   ADDENDUM:  CASE SUMMARY: BREAST BIOMARKER TESTS  Estrogen Receptor (ER) Status: POSITIVE          Percentage of cells with nuclear positivity: Greater than 90%          Average intensity of staining: Strong   Progesterone Receptor (PgR) Status: POSITIVE          Percentage of cells with nuclear positivity: Greater than 90%          Average intensity of staining: Strong   HER2 (by immunohistochemistry): NEGATIVE (Score 1+)  Ki-67: Not performed   under  direct ultrasound visualization, needle aspiration of a mass at 9 o'clock 1 cm from the nipple was performed. Aspirated fluid has a benign appearance so was discarded.    01/06/2022 Cancer Staging   Staging form: Breast, AJCC 8th Edition - Clinical: Stage IA (cT1b, cN0, cM0, G1, ER+, PR+, HER2-) - Signed by Jane Canary, MD on 01/06/2022 Stage prefix: Initial diagnosis Nuclear grade: G1 Histologic grading system: 3 grade system     RISK FACTORS:  Menarche was at age 62.  First live birth at age 61.  OCP use for approximately 10+ years.  Ovaries intact: yes. Hysterectomy: no.  Menopausal status: postmenopausal.  HRT use: 0 years. Colonoscopy: yes; normal. Mammogram within the last year: yes. Past Medical History:  Diagnosis Date   Breast cancer ([place])    right   Diabetes mellitus without complication ([place])    Family history of  adverse reaction to anesthesia    MOM-N/V AND IS HARD TO WAKE   GERD (gastroesophageal reflux disease)    OCC   Hypothyroidism    Pneumonia 2016   Thyroid mass of unclear etiology     Past Surgical History:  Procedure Laterality Date   BREAST BIOPSY Right 12/31/2021   [place] Bx 9:30  4-5 cmfn , ribbon, Invasive mammary ca   BREAST BIOPSY Right 12/31/2021   [place] Bx 9:00 4cmfn, Heart Clip,  FRAGMENTS OF BENIGN INTRAMAMMARY LYMPH NODE. -   BREAST BIOPSY Right 01/14/2022   MM RT RADIO FREQUENCY TAG LOC MAMMO GUIDE 01/14/2022 [facility]   CATARACT EXTRACTION, BILATERAL     CESAREAN SECTION     COLONOSCOPY     PART MASTECTOMY,RADIO FREQUENCY LOCALIZER,AXILLARY SENTINEL NODE BIOPSY Right 01/17/2022   Procedure: PART MASTECTOMY,RADIO FREQUENCY LOCALIZER,AXILLARY SENTINEL NODE BIOPSY;  Surgeon: Herbert Pun, MD;  Location: [facility];  Service: General;  Laterality: Right;   THYROIDECTOMY N/A 09/22/2016   Procedure: THYROIDECTOMY;  Surgeon: Margaretha Sheffield, MD;  Location: [facility];  Service: ENT;  Laterality: N/A;   UMBILICAL HERNIA REPAIR N/A 01/17/2022   Procedure: HERNIA REPAIR UMBILICAL ADULT;  Surgeon: Herbert Pun, MD;  Location: [facility];  Service: General;  Laterality: N/A;    FAMILY HISTORY:  We obtained a detailed, 4-generation family history.  Significant diagnoses are listed below: Family History  Problem Relation Age of Onset   Breast cancer Sister 74       genetic testing negative   Colon cancer Sister 19   Cancer Maternal Grandmother        unk type, metastatic   Cancer Paternal Grandfather        laryngeal   Breast cancer Cousin 85       genetic testing negative   Denise Saunders has 1 son, 30. Denise Saunders has 1 sister who was diagnosed with triple negative breast cancer and colon cancer this year at age 64, she underwent genetic testing that was reportedly negative.   Denise Saunders mother had skin cancer and is living at 8. A maternal cousin had breast  cancer at 24 and also reportedly had negative genetic testing. Maternal grandmother had cancer, unknown type, metastatic, and passed at 32.  Denise Saunders father had cancer in his nose/sinuses at 41 and passed at 36. Paternal grandfather had laryngeal cancer in his 65s, passed at 62.   Denise Saunders is aware of previous family history of genetic testing for hereditary cancer risks. There is no reported Ashkenazi Jewish ancestry. There is no known consanguinity.   GENETIC COUNSELING ASSESSMENT: Denise Saunders is a 61  y.o. female with a personal and family history of breast cancer which is somewhat suggestive of a hereditary cancer syndrome and predisposition to cancer. We, therefore, discussed and recommended the following at today's visit.   DISCUSSION: We discussed that approximately 10% of breast cancer is hereditary. Most cases of hereditary breast cancer are associated with BRCA1/BRCA2 genes, although there are other genes associated with hereditary cancer as well. Cancers and risks are gene specific. Since her sister and cousin had negative genetic testing, it is likely hers would be negative too, although we could still do testing based on her own diagnosis of breast cancer. We discussed that testing is beneficial for several reasons including knowing about cancer risks, identifying potential screening and risk-reduction options that may be appropriate, and to understand if other family members could be at risk for cancer and allow them to undergo genetic testing.   We reviewed the characteristics, features and inheritance patterns of hereditary cancer syndromes. We also discussed genetic testing, including the appropriate family members to test, the process of testing, insurance coverage and turn-around-time for results. We discussed the implications of a negative, positive and/or variant of uncertain significant result. We recommended Denise Saunders pursue genetic testing for the [facility] CancerNext-Expanded+RNA  gene panel.   Based on Denise Saunders personal and family history of cancer, she meets medical criteria for genetic testing. Despite that she meets criteria, she may still have an out of pocket cost. We discussed that if her out of pocket cost for testing is over $100, the laboratory will call and confirm whether she wants to proceed with testing.  If the out of pocket cost of testing is less than $100 she will be billed by the genetic testing laboratory.   PLAN: After considering the risks, benefits, and limitations, Denise Saunders did not wish to pursue genetic testing at today's visit. We understand this decision and remain available to coordinate genetic testing at any time in the future. We, therefore, recommend Denise Saunders continue to follow the cancer screening guidelines given by her primary healthcare provider.  Denise Saunders questions were answered to her satisfaction today. Our contact information was provided should additional questions or concerns arise. Thank you for the referral and allowing [place] to share in the care of your patient.   Denise Rogue, MS, [facility] Genetic Counselor [email or web address] Phone: [phone]  The patient was seen for a total of 20 minutes in face-to-face genetic counseling. Patient's son Rolla Plate was also present.  Dr. Grayland Ormond was available for discussion regarding this case.   _______________________________________________________________________ For Office Staff:  Number of people involved in session: 2 Was an Intern/ student involved with case: no

## 2022-02-11 ENCOUNTER — Encounter: Payer: Self-pay | Admitting: *Deleted

## 2022-02-11 ENCOUNTER — Inpatient Hospital Stay: Payer: BC Managed Care – PPO | Attending: Internal Medicine | Admitting: Internal Medicine

## 2022-02-11 ENCOUNTER — Ambulatory Visit
Admission: RE | Admit: 2022-02-11 | Discharge: 2022-02-11 | Disposition: A | Discharge status: Home / Self Care | Payer: BC Managed Care – PPO | Source: Ambulatory Visit | Attending: Radiation Oncology | Admitting: Radiation Oncology

## 2022-02-11 ENCOUNTER — Encounter: Payer: Self-pay | Admitting: Radiation Oncology

## 2022-02-11 ENCOUNTER — Encounter: Payer: Self-pay | Admitting: Internal Medicine

## 2022-02-11 VITALS — BP 121/86 | HR 112 | Temp 96.3°F | Resp 20 | Wt 160.6 lb

## 2022-02-11 DIAGNOSIS — C50811 Malignant neoplasm of overlapping sites of right female breast: Secondary | ICD-10-CM | POA: Insufficient documentation

## 2022-02-11 DIAGNOSIS — Z87891 Personal history of nicotine dependence: Secondary | ICD-10-CM | POA: Insufficient documentation

## 2022-02-11 DIAGNOSIS — Z5181 Encounter for therapeutic drug level monitoring: Secondary | ICD-10-CM | POA: Diagnosis not present

## 2022-02-11 DIAGNOSIS — Z79811 Long term (current) use of aromatase inhibitors: Secondary | ICD-10-CM | POA: Diagnosis not present

## 2022-02-11 DIAGNOSIS — C50411 Malignant neoplasm of upper-outer quadrant of right female breast: Secondary | ICD-10-CM

## 2022-02-11 DIAGNOSIS — Z170 Estrogen receptor positive status [ER+]: Secondary | ICD-10-CM | POA: Insufficient documentation

## 2022-02-11 DIAGNOSIS — C50911 Malignant neoplasm of unspecified site of right female breast: Secondary | ICD-10-CM

## 2022-02-11 NOTE — Progress Notes (Signed)
Called [facility] to inquire about oncotype dx result, the ERD was today. They are waiting on insurance determination and it could be a couple more days before they hear back from her insurance.

## 2022-02-11 NOTE — Consult Note (Signed)
NEW PATIENT EVALUATION  Name: Denise Saunders  MRN: [id number]  Date:   02/11/2022     DOB: [birth year]   This 61 y.o. female patient presents to the clinic for initial evaluation of stage Ia invasive mammary carcinoma of the right breast (pT1b pN0 M0) ER/PR positive HER2 negative invasive mammary carcinoma status post wide local excision.  REFERRING PHYSICIAN: Sherre Scarlet, P*  CHIEF COMPLAINT: No chief complaint on file.   DIAGNOSIS: The encounter diagnosis was Primary malignant neoplasm of upper outer quadrant of female breast, right ([place]).   PREVIOUS INVESTIGATIONS:  Mammogram ultrasound reviewed Clinical notes reviewed Pathology report reviewed  HPI: Patient is a 61 year old female who presented withAbnormal mammogram of her right breast.  There was a 6 spiculated mass measuring 0.8 cm in the right breast 9:30 position 4 to 5 cm from the nipple.  There is also a 6.6 cm mass in the right breast at 9 o'clock position 4 cm from the nipple.  Both areas underwent ultrasound-guided biopsy with the 930 mass showing invasive mammary carcinoma with tubular features.  The right breast mass at 9 o'clock position was benign.  She underwent a wide local excision for an overall grade one 9 mm invasive mammary carcinoma with margins clear at 2.4 mm.  2 sentinel lymph nodes were negative for metastatic disease.  Tumor again was ER/PR positive HER2/neu not overexpressed.  Oncotype Dx has been ordered and is pending.  She is seen today for radiation oncology evaluation.  She specifically denies breast tenderness cough or bone pain.  PLANNED TREATMENT REGIMEN: Hypofractionated right whole breast radiation  PAST MEDICAL HISTORY:  has a past medical history of Breast cancer ([place]), Diabetes mellitus without complication ([place]), Family history of adverse reaction to anesthesia, GERD (gastroesophageal reflux disease), Hypothyroidism, Pneumonia (2016), and Thyroid mass of unclear etiology.    PAST  SURGICAL HISTORY:  Past Surgical History:  Procedure Laterality Date   BREAST BIOPSY Right 12/31/2021   [place] Bx 9:30  4-5 cmfn , ribbon, Invasive mammary ca   BREAST BIOPSY Right 12/31/2021   [place] Bx 9:00 4cmfn, Heart Clip,  FRAGMENTS OF BENIGN INTRAMAMMARY LYMPH NODE. -   BREAST BIOPSY Right 01/14/2022   MM RT RADIO FREQUENCY TAG LOC MAMMO GUIDE 01/14/2022 [facility]   CATARACT EXTRACTION, BILATERAL     CESAREAN SECTION     COLONOSCOPY     PART MASTECTOMY,RADIO FREQUENCY LOCALIZER,AXILLARY SENTINEL NODE BIOPSY Right 01/17/2022   Procedure: PART MASTECTOMY,RADIO FREQUENCY LOCALIZER,AXILLARY SENTINEL NODE BIOPSY;  Surgeon: Herbert Pun, MD;  Location: [facility];  Service: General;  Laterality: Right;   THYROIDECTOMY N/A 09/22/2016   Procedure: THYROIDECTOMY;  Surgeon: Margaretha Sheffield, MD;  Location: [facility];  Service: ENT;  Laterality: N/A;   UMBILICAL HERNIA REPAIR N/A 01/17/2022   Procedure: HERNIA REPAIR UMBILICAL ADULT;  Surgeon: Herbert Pun, MD;  Location: [facility];  Service: General;  Laterality: N/A;    FAMILY HISTORY: family history includes Breast cancer (age of onset: 31) in her sister; Breast cancer (age of onset: 33) in her cousin; Cancer in her maternal grandmother and paternal grandfather; Colon cancer (age of onset: 68) in her sister.  SOCIAL HISTORY:  reports that she quit smoking about 23 months ago. Her smoking use included cigarettes. She has a 3.75 pack-year smoking history. She has never used smokeless tobacco. She reports that she does not drink alcohol and does not use drugs.  ALLERGIES: Codeine, Dulaglutide, and Liraglutide  MEDICATIONS:  Current Outpatient Medications  Medication Sig Dispense Refill  atorvastatin (LIPITOR) 40 MG tablet Take 40 mg by mouth every evening.     busPIRone (BUSPAR) 15 MG tablet Take 15 mg by mouth 2 (two) times daily.     Continuous Blood Gluc Sensor (FREESTYLE LIBRE 14 DAY SENSOR) MISC SMARTSIG:1 Kit(s) Topical As  Directed     gabapentin (NEURONTIN) 300 MG capsule Take 300 mg by mouth 2 (two) times daily. LUNCH AND bEDTIME     glipiZIDE (GLUCOTROL) 10 MG tablet Take 10 mg by mouth every evening.     Insulin Pen Needle (PEN NEEDLES 31GX5/16") 31G X 8 MM MISC See admin instructions.     levothyroxine (SYNTHROID) 88 MCG tablet Take 88 mcg by mouth daily before breakfast.     metFORMIN (GLUCOPHAGE-XR) 500 MG 24 hr tablet Take 1,000 mg by mouth every evening.     NOVOLIN 70/30 KWIKPEN (70-30) 100 UNIT/ML KwikPen Inject 35-45 Units into the skin 2 (two) times daily. 35 units before breakfast and 45 units before dinner     traZODone (DESYREL) 100 MG tablet Take 100 mg by mouth at bedtime as needed.     venlafaxine XR (EFFEXOR-XR) 150 MG 24 hr capsule Take 150 mg by mouth at bedtime.     No current facility-administered medications for this encounter.    ECOG PERFORMANCE STATUS:  0 - Asymptomatic  REVIEW OF SYSTEMS: Patient denies any weight loss, fatigue, weakness, fever, chills or night sweats. Patient denies any loss of vision, blurred vision. Patient denies any ringing  of the ears or hearing loss. No irregular heartbeat. Patient denies heart murmur or history of fainting. Patient denies any chest pain or pain radiating to her upper extremities. Patient denies any shortness of breath, difficulty breathing at night, cough or hemoptysis. Patient denies any swelling in the lower legs. Patient denies any nausea vomiting, vomiting of blood, or coffee ground material in the vomitus. Patient denies any stomach pain. Patient states has had normal bowel movements no significant constipation or diarrhea. Patient denies any dysuria, hematuria or significant nocturia. Patient denies any problems walking, swelling in the joints or loss of balance. Patient denies any skin changes, loss of hair or loss of weight. Patient denies any excessive worrying or anxiety or significant depression. Patient denies any problems with  insomnia. Patient denies excessive thirst, polyuria, polydipsia. Patient denies any swollen glands, patient denies easy bruising or easy bleeding. Patient denies any recent infections, allergies or URI. Patient "s visual fields have not changed significantly in recent time.   PHYSICAL EXAM: There were no vitals taken for this visit. Patient status post wide local excision of the right breast.  Incision is well-healed.  No dominant masses noted in either breast.  No axillary or supraclavicular adenopathy is identified.  Well-developed well-nourished patient in NAD. HEENT reveals PERLA, EOMI, discs not visualized.  Oral cavity is clear. No oral mucosal lesions are identified. Neck is clear without evidence of cervical or supraclavicular adenopathy. Lungs are clear to A&P. Cardiac examination is essentially unremarkable with regular rate and rhythm without murmur rub or thrill. Abdomen is benign with no organomegaly or masses noted. Motor sensory and DTR levels are equal and symmetric in the upper and lower extremities. Cranial nerves II through XII are grossly intact. Proprioception is intact. No peripheral adenopathy or edema is identified. No motor or sensory levels are noted. Crude visual fields are within normal range.  LABORATORY DATA: Pathology reports reviewed    RADIOLOGY RESULTS: Mammogram and ultrasound reviewed compatible with above-stated findings   IMPRESSION:  Stage Ia invasive mammary carcinoma of the right breast status post wide local excision ER/PR positive in 61 year old female  PLAN: At this time we will await her Oncotype DX before proceeding with simulation.  I tentatively set up a simulation for next week.  Would plan on delivering whole breast radiation hypofractionated regimen over 3 weeks boosting her scar another 1000 cGy.  Risks and benefits of treatment including skin reaction fatigue alteration of blood counts possible inclusion of superficial lung all were described in  detail to the patient.  She seems to comprehend her treatment plan well.  Patient also benefit from endocrine therapy after completion of radiation.  I would like to take this opportunity to thank you for allowing me to participate in the care of your patient.Noreene Filbert, MD

## 2022-02-11 NOTE — Progress Notes (Signed)
[facility] CONSULT NOTE  Patient Care Team: Sena Hitch Maximiano Coss as PCP - General (Physician Assistant) Daiva Huge, RN as Oncology Nurse Navigator  REFERRING PROVIDER: Mychal Beckie Busing, PA  REASON FOR REFFERAL: right breast cancer  CANCER STAGING    Cancer Staging  Breast cancer, right [facility]) Staging form: Breast, AJCC 8th Edition - Clinical: Stage IA (cT1b, cN0, cM0, G1, ER+, PR+, HER2-) - Signed by Jane Canary, MD on 01/06/2022 Stage prefix: Initial diagnosis Nuclear grade: G1 Histologic grading system: 3 grade system   ASSESSMENT & PLAN:  Theodis Aguas 61 y.o. 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  # Right breast IDC, Stage 1A, ER/PR+, HER2- -detected on screening mammogram. - s/p right breast lumpectomy with SLNB by Dr. Peyton Najjar on 01/17/2022.  -Discussed with the patient about the surgical pathology report.  Oncotype testing is pending.  Casper Harrison our breast coordinator reached out and the results are expected in couple of days.  Patient is scheduled to see Dr. Baruch Gouty today.  Will start endocrine therapy after radiation completion.  -We discussed about aromatase inhibitors which include letrozole, anastrozole and exemestane.  Duration of treatment will likely be 5 years. Side effects were discussed including but not limited to myalgias, arthralgias, mood changes, hot flashes, thinning of bone.  Advised to take calcium and vitamin D supplements.  Encouraged weightbearing exercises to help with bone health.  -discussed Aromatase inhibitors versus tamoxifen in early breast cancer: patient-level meta-analysis of the randomised trials. Aromatase inhibitors reduce recurrence rates by about 30% (proportionately) compared with tamoxifen while treatments differ, but not thereafter. 5 years of an  aromatase inhibitor reduces 10-year breast cancer mortality rates by about 15% compared with 5 years of tamoxifen, hence by about 40% (proportionately) compared with no endocrine treatment.   DEXA scan for baseline bone health.  I will send a prescription for letrozole 2.5 mg daily after she completes radiation.  #Family history of cancer -Sister recently diagnosed with triple negative breast cancer and history of colon cancer.  Sister's genetic testing was negative.  Father had history of head cancer -Seen by genetics.  Results pending.    Orders Placed This Encounter  Procedures   DG Bone Density    Standing Status:   Future    Standing Expiration Date:   02/11/2023    Order Specific Question:   Reason for Exam (SYMPTOM  OR DIAGNOSIS REQUIRED)    Answer:   starting aromatase inhibitor    Order Specific Question:   Preferred imaging location?    Answer:   [facility]   RTC in 10 weeks for MD visit.  The total time spent in the appointment was 30 minutes encounter with patients including review of chart and various tests results, discussions about plan of care and coordination of care plan   All questions were answered. The patient knows to call the clinic with any problems, questions or concerns. No barriers to learning was detected.  Jane Canary, MD 12/5/20231:48 PM   HISTORY OF PRESENTING ILLNESS:  RYHANNA DUNSMORE 61 y.o. 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  Patient seen today accompanied by her son.  She is recovering well from her lumpectomy procedure. Patient denies fever,  chills, nausea, vomiting, shortness of breath, cough, abdominal pain, bleeding, bowel or bladder issues. Energy level is good.  Appetite is good.  Denies any weight loss. Denies pain.   I have reviewed her chart and materials related to  her cancer extensively and collaborated history with the patient. Summary of oncologic history is as follows: Oncology History  Breast cancer, right ([place])  11/28/2021 Mammogram   Screening mammogram FINDINGS: In the right breast possible masses in the outer breast require further evaluation.   In the left breast possible asymmetry in the upper breast in the oblique projection requires further evaluation   12/17/2021 Mammogram   Diagnostic mammogram and US IMPRESSION: 1. Suspicious spiculated 0.8 cm mass containing calcifications in the right breast at 9:30 4-5 cm from nipple.   2. Indeterminate 0.6 cm mass in the right breast at 9 o'clock 4 cm from nipple, possibly an intramammary lymph node.   4. Indeterminate 0.5 cm mass in the right breast at 9 o'clock 1 cm from the nipple.  [place] of bilateral axilla negative   12/31/2021 Pathology Results   DIAGNOSIS:  A. BREAST, RIGHT AT 930 O'CLOCK, 4-5 CM FROM THE NIPPLE;  ULTRASOUND-GUIDED CORE NEEDLE BIOPSY:  - INVASIVE MAMMARY CARCINOMA, WITH TUBULAR FEATURES.   Size of invasive carcinoma: 6 mm in this sample  Histologic grade of invasive carcinoma: Grade 1                       Glandular/tubular differentiation score: 1                       Nuclear pleomorphism score: 1                       Mitotic rate score: 1                       Total score: 3  Ductal carcinoma in situ: Present, low-grade  Lymphovascular invasion: Not identified   B. BREAST, RIGHT AT 9:00, 4 CM FROM NIPPLE; ULTRASOUND-GUIDED CORE  NEEDLE BIOPSY:  - FRAGMENTS OF BENIGN INTRAMAMMARY LYMPH NODE.  - FOCAL BACKGROUND BENIGN MAMMARY PARENCHYMA.  - NO EVIDENCE OF MALIGNANCY.   ADDENDUM:  CASE SUMMARY: BREAST BIOMARKER TESTS  Estrogen Receptor (ER) Status: POSITIVE          Percentage of cells with nuclear positivity: Greater than 90%          Average intensity of staining: Strong   Progesterone Receptor (PgR) Status: POSITIVE          Percentage of cells  with nuclear positivity: Greater than 90%          Average intensity of staining: Strong   HER2 (by immunohistochemistry): NEGATIVE (Score 1+)  Ki-67: Not performed   under direct ultrasound visualization, needle aspiration of a mass at 9 o'clock 1 cm from the nipple was performed. Aspirated fluid has a benign appearance so was discarded.    01/17/2022 Definitive Surgery   Right breast lumpectomy with SLNB by Dr. Peyton Najjar  Surgical pathology showed invasive tubular carcinoma, DCIS low-grade, overall grade 1, size 9 mm, single focus of invasive carcinoma, LVI absent, margins negative, 0/2 lymph nodes involved.  pT1b N0    Menarche unsure Children 1 son Age at first birth 71 Birth control OCP more than 5 years Menopause age 46 Hysterectomy no HRT no  MEDICAL HISTORY:  Past Medical History:  Diagnosis Date  Breast cancer ([place])    right   Diabetes mellitus without complication ([place])    Family history of adverse reaction to anesthesia    MOM-N/V AND IS HARD TO WAKE   GERD (gastroesophageal reflux disease)    OCC   Hypothyroidism    Pneumonia 2016   Thyroid mass of unclear etiology     SURGICAL HISTORY: Past Surgical History:  Procedure Laterality Date   BREAST BIOPSY Right 12/31/2021   [place] Bx 9:30  4-5 cmfn , ribbon, Invasive mammary ca   BREAST BIOPSY Right 12/31/2021   [place] Bx 9:00 4cmfn, Heart Clip,  FRAGMENTS OF BENIGN INTRAMAMMARY LYMPH NODE. -   BREAST BIOPSY Right 01/14/2022   MM RT RADIO FREQUENCY TAG LOC MAMMO GUIDE 01/14/2022 [facility]   CATARACT EXTRACTION, BILATERAL     CESAREAN SECTION     COLONOSCOPY     PART MASTECTOMY,RADIO FREQUENCY LOCALIZER,AXILLARY SENTINEL NODE BIOPSY Right 01/17/2022   Procedure: PART MASTECTOMY,RADIO FREQUENCY LOCALIZER,AXILLARY SENTINEL NODE BIOPSY;  Surgeon: Herbert Pun, MD;  Location: [facility];  Service: General;  Laterality: Right;   THYROIDECTOMY N/A 09/22/2016   Procedure: THYROIDECTOMY;  Surgeon: Margaretha Sheffield, MD;  Location: [facility];  Service: ENT;  Laterality: N/A;   UMBILICAL HERNIA REPAIR N/A 01/17/2022   Procedure: HERNIA REPAIR UMBILICAL ADULT;  Surgeon: Herbert Pun, MD;  Location: [facility];  Service: General;  Laterality: N/A;    SOCIAL HISTORY: Social History   Socioeconomic History   Marital status: Married    Spouse name: Not on file   Number of children: Not on file   Years of education: Not on file   Highest education level: Not on file  Occupational History   Not on file  Tobacco Use   Smoking status: Former    Packs/day: 0.25    Years: 15.00    Total pack years: 3.75    Types: Cigarettes    Quit date: 2022    Years since quitting: 1.9   Smokeless tobacco: Never  Vaping Use   Vaping Use: Never used  Substance and Sexual Activity   Alcohol use: No   Drug use: No   Sexual activity: Not Currently    Birth control/protection: None  Other Topics Concern   Not on file  Social History Narrative   Not on file   Social Determinants of Health   Financial Resource Strain: Not on file  Food Insecurity: Not on file  Transportation Needs: Not on file  Physical Activity: Not on file  Stress: Not on file  Social Connections: Not on file  Intimate Partner Violence: Not on file    FAMILY HISTORY: Family History  Problem Relation Age of Onset   Breast cancer Sister 77       genetic testing negative   Colon cancer Sister 61   Cancer Maternal Grandmother        unk type, metastatic   Cancer Paternal Grandfather        laryngeal   Breast cancer Cousin 8       genetic testing negative    ALLERGIES:  is allergic to codeine, dulaglutide, and liraglutide.  MEDICATIONS:  Current Outpatient Medications  Medication Sig Dispense Refill   atorvastatin (LIPITOR) 40 MG tablet Take 40 mg by mouth every evening.     busPIRone (BUSPAR) 15 MG tablet Take 15 mg by mouth 2 (two) times daily.     Continuous Blood Gluc Sensor (FREESTYLE LIBRE 14 DAY SENSOR) MISC  SMARTSIG:1 Kit(s) Topical As Directed  gabapentin (NEURONTIN) 300 MG capsule Take 300 mg by mouth 2 (two) times daily. LUNCH AND bEDTIME     glipiZIDE (GLUCOTROL) 10 MG tablet Take 10 mg by mouth every evening.     Insulin Pen Needle (PEN NEEDLES 31GX5/16") 31G X 8 MM MISC See admin instructions.     levothyroxine (SYNTHROID) 88 MCG tablet Take 88 mcg by mouth daily before breakfast.     metFORMIN (GLUCOPHAGE-XR) 500 MG 24 hr tablet Take 1,000 mg by mouth every evening.     NOVOLIN 70/30 KWIKPEN (70-30) 100 UNIT/ML KwikPen Inject 35-45 Units into the skin 2 (two) times daily. 35 units before breakfast and 45 units before dinner     traZODone (DESYREL) 100 MG tablet Take 100 mg by mouth at bedtime as needed.     venlafaxine XR (EFFEXOR-XR) 150 MG 24 hr capsule Take 150 mg by mouth at bedtime.     No current facility-administered medications for this visit.    REVIEW OF SYSTEMS:   Pertinent information mentioned in HPI All other systems were reviewed with the patient and are negative.  PHYSICAL EXAMINATION: ECOG PERFORMANCE STATUS: 0 - Asymptomatic  Vitals:   02/11/22 1307  BP: 121/86  Pulse: (!) 112  Resp: 20  Temp: (!) 96.3 F (35.7 C)  SpO2: 100%    Filed Weights   02/11/22 1307  Weight: 160 lb 9.6 oz (72.8 kg)    GENERAL:alert, no distress and comfortable SKIN: skin color, texture, turgor are normal, no rashes or significant lesions EYES: normal, conjunctiva are pink and non-injected, sclera clear OROPHARYNX:no exudate, no erythema and lips, buccal mucosa, and tongue normal  NECK: supple, thyroid normal size, non-tender, without nodularity LYMPH:  no palpable lymphadenopathy in the cervical, axillary or inguinal LUNGS: clear to auscultation and percussion with normal breathing effort HEART: regular rate & rhythm and no murmurs and no lower extremity edema ABDOMEN:abdomen soft, non-tender and normal bowel sounds Musculoskeletal:no cyanosis of digits and no clubbing   PSYCH: alert & oriented x 3 with fluent speech NEURO: no focal motor/sensory deficits  LABORATORY DATA:  I have reviewed the data as listed Lab Results  Component Value Date   WBC 8.7 05/05/2011   HGB 14.1 05/05/2011   HCT 42.2 05/05/2011   MCV 92 05/05/2011   PLT 266 05/05/2011   No results for input(s): "NA", "K", "CL", "CO2", "GLUCOSE", "BUN", "CREATININE", "CALCIUM", "GFRNONAA", "GFRAA", "PROT", "ALBUMIN", "AST", "ALT", "ALKPHOS", "BILITOT", "BILIDIR", "IBILI" in the last 8760 hours.  RADIOGRAPHIC STUDIES: I have personally reviewed the radiological images as listed and agreed with the findings in the report. MM Breast Surgical Specimen  Result Date: 01/17/2022 CLINICAL DATA:  Patient status post right breast lumpectomy EXAM: SPECIMEN RADIOGRAPH OF THE RIGHT BREAST COMPARISON:  Previous exam(s). FINDINGS: Status post excision of the right breast. The tag and biopsy marker clip are present and completely intact. IMPRESSION: Specimen radiograph of the right breast. Electronically Signed   By: Lovey Newcomer M.D.   On: 01/17/2022 16:23  NM Sentinel Node Inj-No Rpt (Breast)  Result Date: 01/17/2022 Sulfur Colloid was injected by the Nuclear Medicine Technologist for sentinel lymph node localization.   MM RT RADIO FREQUENCY TAG LOC MAMMO GUIDE  Result Date: 01/14/2022 CLINICAL DATA:  Status post 2 site ultrasound-guided biopsy. RIBBON clip demonstrated invasive mammary carcinoma. Heart clip demonstrated benign intramammary lymph node. EXAM: MAMMOGRAPHIC GUIDED RADIOFREQUENCY DEVICE LOCALIZATION OF THE RIGHT BREAST COMPARISON:  Previous exam(s). FINDINGS: Patient presents for radiofrequency device localization prior to lumpectomy. I met with  the patient and we discussed the procedure of radiofrequency device localization including benefits and alternatives. We discussed the high likelihood of a successful procedure. We discussed the risks of the procedure including infection, bleeding,  tissue injury and further surgery. Informed, written consent was given. The usual time-out protocol was performed immediately prior to the procedure. Using mammographic guidance, sterile technique, 1% lidocaine as local anesthesia, a RF ID tag (RF ID [id number]) was used to localize the RIBBON clip using a lateral approach. The follow-up mammogram images confirm that the RF ID tag is along the posterior aspect of the biopsy-proven malignancy and is approximately 4-5 mm posterior to the RIBBON clip. Images are marked for Dr. Windell Moment. The patient tolerated the procedure well and was released from the [facility]. IMPRESSION: Radiofrequency device localization of the RIGHT breast. No apparent complications. Electronically Signed   By: Valentino Saxon M.D.   On: 01/14/2022 16:26

## 2022-02-11 NOTE — Progress Notes (Signed)
Patient has no concerns 

## 2022-02-13 ENCOUNTER — Encounter: Payer: Self-pay | Admitting: Internal Medicine

## 2022-02-20 ENCOUNTER — Encounter: Payer: Self-pay | Admitting: *Deleted

## 2022-02-20 ENCOUNTER — Ambulatory Visit
Admission: RE | Admit: 2022-02-20 | Discharge: 2022-02-20 | Disposition: A | Discharge status: Home / Self Care | Payer: BC Managed Care – PPO | Source: Ambulatory Visit | Attending: Radiation Oncology | Admitting: Radiation Oncology

## 2022-02-20 DIAGNOSIS — Z510 Encounter for antineoplastic radiation therapy: Secondary | ICD-10-CM | POA: Insufficient documentation

## 2022-02-20 DIAGNOSIS — Z87891 Personal history of nicotine dependence: Secondary | ICD-10-CM | POA: Insufficient documentation

## 2022-02-20 DIAGNOSIS — C50811 Malignant neoplasm of overlapping sites of right female breast: Secondary | ICD-10-CM | POA: Insufficient documentation

## 2022-02-20 DIAGNOSIS — Z170 Estrogen receptor positive status [ER+]: Secondary | ICD-10-CM | POA: Diagnosis not present

## 2022-02-21 ENCOUNTER — Other Ambulatory Visit: Payer: Self-pay | Admitting: *Deleted

## 2022-02-21 DIAGNOSIS — C50411 Malignant neoplasm of upper-outer quadrant of right female breast: Secondary | ICD-10-CM

## 2022-02-26 DIAGNOSIS — Z510 Encounter for antineoplastic radiation therapy: Secondary | ICD-10-CM | POA: Diagnosis not present

## 2022-02-27 ENCOUNTER — Ambulatory Visit
Admission: RE | Admit: 2022-02-27 | Discharge: 2022-02-27 | Disposition: A | Discharge status: Home / Self Care | Payer: BC Managed Care – PPO | Source: Ambulatory Visit | Attending: Radiation Oncology | Admitting: Radiation Oncology

## 2022-02-27 DIAGNOSIS — Z510 Encounter for antineoplastic radiation therapy: Secondary | ICD-10-CM | POA: Diagnosis not present

## 2022-03-04 ENCOUNTER — Other Ambulatory Visit: Payer: Self-pay

## 2022-03-04 ENCOUNTER — Ambulatory Visit
Admission: RE | Admit: 2022-03-04 | Discharge: 2022-03-04 | Disposition: A | Discharge status: Home / Self Care | Payer: BC Managed Care – PPO | Source: Ambulatory Visit | Attending: Radiation Oncology | Admitting: Radiation Oncology

## 2022-03-04 DIAGNOSIS — Z510 Encounter for antineoplastic radiation therapy: Secondary | ICD-10-CM | POA: Diagnosis not present

## 2022-03-04 LAB — RAD ONC ARIA SESSION SUMMARY
Course Elapsed Days: 0
Plan Fractions Treated to Date: 1
Plan Prescribed Dose Per Fraction: 2.66 Gy
Plan Total Fractions Prescribed: 16
Plan Total Prescribed Dose: 42.56 Gy
Reference Point Dosage Given to Date: 2.66 Gy
Reference Point Session Dosage Given: 2.66 Gy
Session Number: 1

## 2022-03-05 ENCOUNTER — Ambulatory Visit
Admission: RE | Admit: 2022-03-05 | Discharge: 2022-03-05 | Disposition: A | Discharge status: Home / Self Care | Payer: BC Managed Care – PPO | Source: Ambulatory Visit | Attending: Radiation Oncology | Admitting: Radiation Oncology

## 2022-03-05 ENCOUNTER — Other Ambulatory Visit: Payer: Self-pay

## 2022-03-05 DIAGNOSIS — Z510 Encounter for antineoplastic radiation therapy: Secondary | ICD-10-CM | POA: Diagnosis not present

## 2022-03-05 LAB — RAD ONC ARIA SESSION SUMMARY
Course Elapsed Days: 1
Plan Fractions Treated to Date: 2
Plan Prescribed Dose Per Fraction: 2.66 Gy
Plan Total Fractions Prescribed: 16
Plan Total Prescribed Dose: 42.56 Gy
Reference Point Dosage Given to Date: 5.32 Gy
Reference Point Session Dosage Given: 2.66 Gy
Session Number: 2

## 2022-03-06 ENCOUNTER — Ambulatory Visit
Admission: RE | Admit: 2022-03-06 | Discharge: 2022-03-06 | Disposition: A | Discharge status: Home / Self Care | Payer: BC Managed Care – PPO | Source: Ambulatory Visit | Attending: Radiation Oncology | Admitting: Radiation Oncology

## 2022-03-06 ENCOUNTER — Other Ambulatory Visit: Payer: Self-pay

## 2022-03-06 DIAGNOSIS — Z510 Encounter for antineoplastic radiation therapy: Secondary | ICD-10-CM | POA: Diagnosis not present

## 2022-03-06 LAB — RAD ONC ARIA SESSION SUMMARY
Course Elapsed Days: 2
Plan Fractions Treated to Date: 3
Plan Prescribed Dose Per Fraction: 2.66 Gy
Plan Total Fractions Prescribed: 16
Plan Total Prescribed Dose: 42.56 Gy
Reference Point Dosage Given to Date: 7.98 Gy
Reference Point Session Dosage Given: 2.66 Gy
Session Number: 3

## 2022-03-07 ENCOUNTER — Other Ambulatory Visit: Payer: Self-pay

## 2022-03-07 ENCOUNTER — Ambulatory Visit
Admission: RE | Admit: 2022-03-07 | Discharge: 2022-03-07 | Disposition: A | Discharge status: Home / Self Care | Payer: BC Managed Care – PPO | Source: Ambulatory Visit | Attending: Radiation Oncology | Admitting: Radiation Oncology

## 2022-03-07 DIAGNOSIS — Z510 Encounter for antineoplastic radiation therapy: Secondary | ICD-10-CM | POA: Diagnosis not present

## 2022-03-07 LAB — RAD ONC ARIA SESSION SUMMARY
Course Elapsed Days: 3
Plan Fractions Treated to Date: 4
Plan Prescribed Dose Per Fraction: 2.66 Gy
Plan Total Fractions Prescribed: 16
Plan Total Prescribed Dose: 42.56 Gy
Reference Point Dosage Given to Date: 10.64 Gy
Reference Point Session Dosage Given: 2.66 Gy
Session Number: 4

## 2022-03-11 ENCOUNTER — Other Ambulatory Visit: Payer: Self-pay

## 2022-03-11 ENCOUNTER — Ambulatory Visit
Admission: RE | Admit: 2022-03-11 | Discharge: 2022-03-11 | Disposition: A | Discharge status: Home / Self Care | Payer: BC Managed Care – PPO | Source: Ambulatory Visit | Attending: Radiation Oncology | Admitting: Radiation Oncology

## 2022-03-11 DIAGNOSIS — C50811 Malignant neoplasm of overlapping sites of right female breast: Secondary | ICD-10-CM | POA: Diagnosis not present

## 2022-03-11 DIAGNOSIS — Z170 Estrogen receptor positive status [ER+]: Secondary | ICD-10-CM | POA: Diagnosis not present

## 2022-03-11 DIAGNOSIS — Z510 Encounter for antineoplastic radiation therapy: Secondary | ICD-10-CM | POA: Diagnosis not present

## 2022-03-11 DIAGNOSIS — Z87891 Personal history of nicotine dependence: Secondary | ICD-10-CM | POA: Diagnosis not present

## 2022-03-11 LAB — RAD ONC ARIA SESSION SUMMARY
Course Elapsed Days: 7
Plan Fractions Treated to Date: 5
Plan Prescribed Dose Per Fraction: 2.66 Gy
Plan Total Fractions Prescribed: 16
Plan Total Prescribed Dose: 42.56 Gy
Reference Point Dosage Given to Date: 13.3 Gy
Reference Point Session Dosage Given: 2.66 Gy
Session Number: 5

## 2022-03-12 ENCOUNTER — Other Ambulatory Visit: Payer: Self-pay

## 2022-03-12 ENCOUNTER — Ambulatory Visit
Admission: RE | Admit: 2022-03-12 | Discharge: 2022-03-12 | Disposition: A | Discharge status: Home / Self Care | Payer: BC Managed Care – PPO | Source: Ambulatory Visit | Attending: Radiation Oncology | Admitting: Radiation Oncology

## 2022-03-12 DIAGNOSIS — Z510 Encounter for antineoplastic radiation therapy: Secondary | ICD-10-CM | POA: Diagnosis not present

## 2022-03-12 LAB — RAD ONC ARIA SESSION SUMMARY
Course Elapsed Days: 8
Plan Fractions Treated to Date: 6
Plan Prescribed Dose Per Fraction: 2.66 Gy
Plan Total Fractions Prescribed: 16
Plan Total Prescribed Dose: 42.56 Gy
Reference Point Dosage Given to Date: 15.96 Gy
Reference Point Session Dosage Given: 2.66 Gy
Session Number: 6

## 2022-03-13 ENCOUNTER — Ambulatory Visit
Admission: RE | Admit: 2022-03-13 | Discharge: 2022-03-13 | Disposition: A | Discharge status: Home / Self Care | Payer: BC Managed Care – PPO | Source: Ambulatory Visit | Attending: Radiation Oncology | Admitting: Radiation Oncology

## 2022-03-13 ENCOUNTER — Other Ambulatory Visit: Payer: Self-pay

## 2022-03-13 DIAGNOSIS — Z510 Encounter for antineoplastic radiation therapy: Secondary | ICD-10-CM | POA: Diagnosis not present

## 2022-03-13 LAB — RAD ONC ARIA SESSION SUMMARY
Course Elapsed Days: 9
Plan Fractions Treated to Date: 7
Plan Prescribed Dose Per Fraction: 2.66 Gy
Plan Total Fractions Prescribed: 16
Plan Total Prescribed Dose: 42.56 Gy
Reference Point Dosage Given to Date: 18.62 Gy
Reference Point Session Dosage Given: 2.66 Gy
Session Number: 7

## 2022-03-14 ENCOUNTER — Ambulatory Visit
Admission: RE | Admit: 2022-03-14 | Discharge: 2022-03-14 | Disposition: A | Discharge status: Home / Self Care | Payer: BC Managed Care – PPO | Source: Ambulatory Visit | Attending: Radiation Oncology | Admitting: Radiation Oncology

## 2022-03-14 ENCOUNTER — Inpatient Hospital Stay: Payer: BC Managed Care – PPO

## 2022-03-14 ENCOUNTER — Encounter: Payer: Self-pay | Admitting: General Surgery

## 2022-03-14 ENCOUNTER — Other Ambulatory Visit: Payer: Self-pay

## 2022-03-14 DIAGNOSIS — C50411 Malignant neoplasm of upper-outer quadrant of right female breast: Secondary | ICD-10-CM

## 2022-03-14 DIAGNOSIS — C50811 Malignant neoplasm of overlapping sites of right female breast: Secondary | ICD-10-CM | POA: Insufficient documentation

## 2022-03-14 DIAGNOSIS — Z170 Estrogen receptor positive status [ER+]: Secondary | ICD-10-CM | POA: Insufficient documentation

## 2022-03-14 DIAGNOSIS — Z510 Encounter for antineoplastic radiation therapy: Secondary | ICD-10-CM | POA: Diagnosis not present

## 2022-03-14 LAB — CBC
HCT: 41.6 % (ref 36.0–46.0)
Hemoglobin: 14.5 g/dL (ref 12.0–15.0)
MCH: 31 pg (ref 26.0–34.0)
MCHC: 34.9 g/dL (ref 30.0–36.0)
MCV: 89.1 fL (ref 80.0–100.0)
Platelets: 262 10*3/uL (ref 150–400)
RBC: 4.67 MIL/uL (ref 3.87–5.11)
RDW: 12.1 % (ref 11.5–15.5)
WBC: 9 10*3/uL (ref 4.0–10.5)
nRBC: 0 % (ref 0.0–0.2)

## 2022-03-14 LAB — RAD ONC ARIA SESSION SUMMARY
Course Elapsed Days: 10
Plan Fractions Treated to Date: 8
Plan Prescribed Dose Per Fraction: 2.66 Gy
Plan Total Fractions Prescribed: 16
Plan Total Prescribed Dose: 42.56 Gy
Reference Point Dosage Given to Date: 21.28 Gy
Reference Point Session Dosage Given: 2.66 Gy
Session Number: 8

## 2022-03-17 ENCOUNTER — Ambulatory Visit
Admission: RE | Admit: 2022-03-17 | Discharge: 2022-03-17 | Disposition: A | Discharge status: Home / Self Care | Payer: BC Managed Care – PPO | Source: Ambulatory Visit | Attending: Radiation Oncology | Admitting: Radiation Oncology

## 2022-03-17 ENCOUNTER — Other Ambulatory Visit: Payer: Self-pay

## 2022-03-17 DIAGNOSIS — Z510 Encounter for antineoplastic radiation therapy: Secondary | ICD-10-CM | POA: Diagnosis not present

## 2022-03-17 LAB — RAD ONC ARIA SESSION SUMMARY
Course Elapsed Days: 13
Plan Fractions Treated to Date: 9
Plan Prescribed Dose Per Fraction: 2.66 Gy
Plan Total Fractions Prescribed: 16
Plan Total Prescribed Dose: 42.56 Gy
Reference Point Dosage Given to Date: 23.94 Gy
Reference Point Session Dosage Given: 2.66 Gy
Session Number: 9

## 2022-03-18 ENCOUNTER — Ambulatory Visit
Admission: RE | Admit: 2022-03-18 | Discharge: 2022-03-18 | Disposition: A | Discharge status: Home / Self Care | Payer: BC Managed Care – PPO | Source: Ambulatory Visit | Attending: Radiation Oncology | Admitting: Radiation Oncology

## 2022-03-18 ENCOUNTER — Other Ambulatory Visit: Payer: Self-pay

## 2022-03-18 DIAGNOSIS — Z510 Encounter for antineoplastic radiation therapy: Secondary | ICD-10-CM | POA: Diagnosis not present

## 2022-03-18 LAB — RAD ONC ARIA SESSION SUMMARY
Course Elapsed Days: 14
Plan Fractions Treated to Date: 10
Plan Prescribed Dose Per Fraction: 2.66 Gy
Plan Total Fractions Prescribed: 16
Plan Total Prescribed Dose: 42.56 Gy
Reference Point Dosage Given to Date: 26.6 Gy
Reference Point Session Dosage Given: 2.66 Gy
Session Number: 10

## 2022-03-19 ENCOUNTER — Other Ambulatory Visit: Payer: Self-pay

## 2022-03-19 ENCOUNTER — Ambulatory Visit
Admission: RE | Admit: 2022-03-19 | Discharge: 2022-03-19 | Disposition: A | Discharge status: Home / Self Care | Payer: BC Managed Care – PPO | Source: Ambulatory Visit | Attending: Radiation Oncology | Admitting: Radiation Oncology

## 2022-03-19 DIAGNOSIS — Z510 Encounter for antineoplastic radiation therapy: Secondary | ICD-10-CM | POA: Diagnosis not present

## 2022-03-19 LAB — RAD ONC ARIA SESSION SUMMARY
Course Elapsed Days: 15
Plan Fractions Treated to Date: 11
Plan Prescribed Dose Per Fraction: 2.66 Gy
Plan Total Fractions Prescribed: 16
Plan Total Prescribed Dose: 42.56 Gy
Reference Point Dosage Given to Date: 29.26 Gy
Reference Point Session Dosage Given: 2.66 Gy
Session Number: 11

## 2022-03-20 ENCOUNTER — Ambulatory Visit: Payer: BC Managed Care – PPO

## 2022-03-20 DIAGNOSIS — Z510 Encounter for antineoplastic radiation therapy: Secondary | ICD-10-CM | POA: Diagnosis not present

## 2022-03-21 ENCOUNTER — Ambulatory Visit: Payer: BC Managed Care – PPO

## 2022-03-24 ENCOUNTER — Ambulatory Visit
Admission: RE | Admit: 2022-03-24 | Discharge: 2022-03-24 | Disposition: A | Discharge status: Home / Self Care | Payer: BC Managed Care – PPO | Source: Ambulatory Visit | Attending: Radiation Oncology | Admitting: Radiation Oncology

## 2022-03-24 ENCOUNTER — Other Ambulatory Visit: Payer: Self-pay

## 2022-03-24 DIAGNOSIS — Z510 Encounter for antineoplastic radiation therapy: Secondary | ICD-10-CM | POA: Diagnosis not present

## 2022-03-24 LAB — RAD ONC ARIA SESSION SUMMARY
Course Elapsed Days: 20
Plan Fractions Treated to Date: 12
Plan Prescribed Dose Per Fraction: 2.66 Gy
Plan Total Fractions Prescribed: 16
Plan Total Prescribed Dose: 42.56 Gy
Reference Point Dosage Given to Date: 31.92 Gy
Reference Point Session Dosage Given: 2.66 Gy
Session Number: 12

## 2022-03-25 ENCOUNTER — Other Ambulatory Visit: Payer: Self-pay

## 2022-03-25 ENCOUNTER — Ambulatory Visit
Admission: RE | Admit: 2022-03-25 | Discharge: 2022-03-25 | Disposition: A | Discharge status: Home / Self Care | Payer: BC Managed Care – PPO | Source: Ambulatory Visit | Attending: Radiation Oncology | Admitting: Radiation Oncology

## 2022-03-25 DIAGNOSIS — Z510 Encounter for antineoplastic radiation therapy: Secondary | ICD-10-CM | POA: Diagnosis not present

## 2022-03-25 LAB — RAD ONC ARIA SESSION SUMMARY
Course Elapsed Days: 21
Plan Fractions Treated to Date: 13
Plan Prescribed Dose Per Fraction: 2.66 Gy
Plan Total Fractions Prescribed: 16
Plan Total Prescribed Dose: 42.56 Gy
Reference Point Dosage Given to Date: 34.58 Gy
Reference Point Session Dosage Given: 2.66 Gy
Session Number: 13

## 2022-03-26 ENCOUNTER — Other Ambulatory Visit: Payer: Self-pay

## 2022-03-26 ENCOUNTER — Ambulatory Visit
Admission: RE | Admit: 2022-03-26 | Discharge: 2022-03-26 | Disposition: A | Discharge status: Home / Self Care | Payer: BC Managed Care – PPO | Source: Ambulatory Visit | Attending: Radiation Oncology | Admitting: Radiation Oncology

## 2022-03-26 ENCOUNTER — Ambulatory Visit: Payer: BC Managed Care – PPO

## 2022-03-26 DIAGNOSIS — Z510 Encounter for antineoplastic radiation therapy: Secondary | ICD-10-CM | POA: Diagnosis not present

## 2022-03-26 LAB — RAD ONC ARIA SESSION SUMMARY
Course Elapsed Days: 22
Plan Fractions Treated to Date: 14
Plan Prescribed Dose Per Fraction: 2.66 Gy
Plan Total Fractions Prescribed: 16
Plan Total Prescribed Dose: 42.56 Gy
Reference Point Dosage Given to Date: 37.24 Gy
Reference Point Session Dosage Given: 2.66 Gy
Session Number: 14

## 2022-03-27 ENCOUNTER — Ambulatory Visit: Payer: BC Managed Care – PPO

## 2022-03-27 ENCOUNTER — Other Ambulatory Visit: Payer: Self-pay

## 2022-03-27 ENCOUNTER — Ambulatory Visit
Admission: RE | Admit: 2022-03-27 | Discharge: 2022-03-27 | Disposition: A | Discharge status: Home / Self Care | Payer: BC Managed Care – PPO | Source: Ambulatory Visit | Attending: Radiation Oncology | Admitting: Radiation Oncology

## 2022-03-27 DIAGNOSIS — Z510 Encounter for antineoplastic radiation therapy: Secondary | ICD-10-CM | POA: Diagnosis not present

## 2022-03-27 LAB — RAD ONC ARIA SESSION SUMMARY
Course Elapsed Days: 23
Plan Fractions Treated to Date: 15
Plan Prescribed Dose Per Fraction: 2.66 Gy
Plan Total Fractions Prescribed: 16
Plan Total Prescribed Dose: 42.56 Gy
Reference Point Dosage Given to Date: 39.9 Gy
Reference Point Session Dosage Given: 2.66 Gy
Session Number: 15

## 2022-03-28 ENCOUNTER — Ambulatory Visit: Admission: RE | Admit: 2022-03-28 | Payer: BC Managed Care – PPO | Source: Ambulatory Visit

## 2022-03-28 ENCOUNTER — Ambulatory Visit: Payer: BC Managed Care – PPO

## 2022-03-28 ENCOUNTER — Ambulatory Visit
Admission: RE | Admit: 2022-03-28 | Discharge: 2022-03-28 | Disposition: A | Discharge status: Home / Self Care | Payer: BC Managed Care – PPO | Source: Ambulatory Visit | Attending: Radiation Oncology | Admitting: Radiation Oncology

## 2022-03-28 ENCOUNTER — Inpatient Hospital Stay: Payer: BC Managed Care – PPO

## 2022-03-28 ENCOUNTER — Other Ambulatory Visit: Payer: Self-pay

## 2022-03-28 DIAGNOSIS — Z510 Encounter for antineoplastic radiation therapy: Secondary | ICD-10-CM | POA: Diagnosis not present

## 2022-03-28 DIAGNOSIS — C50411 Malignant neoplasm of upper-outer quadrant of right female breast: Secondary | ICD-10-CM

## 2022-03-28 LAB — RAD ONC ARIA SESSION SUMMARY
Course Elapsed Days: 24
Plan Fractions Treated to Date: 16
Plan Prescribed Dose Per Fraction: 2.66 Gy
Plan Total Fractions Prescribed: 16
Plan Total Prescribed Dose: 42.56 Gy
Reference Point Dosage Given to Date: 42.56 Gy
Reference Point Session Dosage Given: 2.66 Gy
Session Number: 16

## 2022-03-28 LAB — CBC
HCT: 43.5 % (ref 36.0–46.0)
Hemoglobin: 14.6 g/dL (ref 12.0–15.0)
MCH: 30.9 pg (ref 26.0–34.0)
MCHC: 33.6 g/dL (ref 30.0–36.0)
MCV: 92 fL (ref 80.0–100.0)
Platelets: 237 10*3/uL (ref 150–400)
RBC: 4.73 MIL/uL (ref 3.87–5.11)
RDW: 12.4 % (ref 11.5–15.5)
WBC: 7.8 10*3/uL (ref 4.0–10.5)
nRBC: 0 % (ref 0.0–0.2)

## 2022-03-31 ENCOUNTER — Other Ambulatory Visit: Payer: Self-pay

## 2022-03-31 ENCOUNTER — Ambulatory Visit
Admission: RE | Admit: 2022-03-31 | Discharge: 2022-03-31 | Disposition: A | Discharge status: Home / Self Care | Payer: BC Managed Care – PPO | Source: Ambulatory Visit | Attending: Radiation Oncology | Admitting: Radiation Oncology

## 2022-03-31 ENCOUNTER — Other Ambulatory Visit: Payer: Self-pay | Admitting: Internal Medicine

## 2022-03-31 DIAGNOSIS — Z510 Encounter for antineoplastic radiation therapy: Secondary | ICD-10-CM | POA: Diagnosis not present

## 2022-03-31 LAB — RAD ONC ARIA SESSION SUMMARY
Course Elapsed Days: 27
Plan Fractions Treated to Date: 1
Plan Prescribed Dose Per Fraction: 2 Gy
Plan Total Fractions Prescribed: 5
Plan Total Prescribed Dose: 10 Gy
Reference Point Dosage Given to Date: 2 Gy
Reference Point Session Dosage Given: 2 Gy
Session Number: 17

## 2022-03-31 MED ORDER — LETROZOLE 2.5 MG PO TABS: 2.5000 mg | ORAL_TABLET | Freq: Every day | ORAL | 2 refills | Status: DC

## 2022-03-31 NOTE — Progress Notes (Signed)
Letrozole script sent. To start after completion of RT.

## 2022-04-01 ENCOUNTER — Other Ambulatory Visit: Payer: Self-pay

## 2022-04-01 ENCOUNTER — Ambulatory Visit
Admission: RE | Admit: 2022-04-01 | Discharge: 2022-04-01 | Disposition: A | Discharge status: Home / Self Care | Payer: BC Managed Care – PPO | Source: Ambulatory Visit | Attending: Radiation Oncology | Admitting: Radiation Oncology

## 2022-04-01 DIAGNOSIS — Z510 Encounter for antineoplastic radiation therapy: Secondary | ICD-10-CM | POA: Diagnosis not present

## 2022-04-01 LAB — RAD ONC ARIA SESSION SUMMARY
Course Elapsed Days: 28
Plan Fractions Treated to Date: 2
Plan Prescribed Dose Per Fraction: 2 Gy
Plan Total Fractions Prescribed: 5
Plan Total Prescribed Dose: 10 Gy
Reference Point Dosage Given to Date: 4 Gy
Reference Point Session Dosage Given: 2 Gy
Session Number: 18

## 2022-04-02 ENCOUNTER — Ambulatory Visit: Payer: BC Managed Care – PPO

## 2022-04-02 ENCOUNTER — Other Ambulatory Visit: Payer: Self-pay

## 2022-04-02 ENCOUNTER — Ambulatory Visit
Admission: RE | Admit: 2022-04-02 | Discharge: 2022-04-02 | Disposition: A | Discharge status: Home / Self Care | Payer: BC Managed Care – PPO | Source: Ambulatory Visit | Attending: Radiation Oncology | Admitting: Radiation Oncology

## 2022-04-02 DIAGNOSIS — Z510 Encounter for antineoplastic radiation therapy: Secondary | ICD-10-CM | POA: Diagnosis not present

## 2022-04-02 LAB — RAD ONC ARIA SESSION SUMMARY
Course Elapsed Days: 29
Plan Fractions Treated to Date: 3
Plan Prescribed Dose Per Fraction: 2 Gy
Plan Total Fractions Prescribed: 5
Plan Total Prescribed Dose: 10 Gy
Reference Point Dosage Given to Date: 6 Gy
Reference Point Session Dosage Given: 2 Gy
Session Number: 19

## 2022-04-03 ENCOUNTER — Ambulatory Visit: Payer: BC Managed Care – PPO

## 2022-04-03 ENCOUNTER — Other Ambulatory Visit: Payer: Self-pay

## 2022-04-03 ENCOUNTER — Ambulatory Visit
Admission: RE | Admit: 2022-04-03 | Discharge: 2022-04-03 | Disposition: A | Discharge status: Home / Self Care | Payer: BC Managed Care – PPO | Source: Ambulatory Visit | Attending: Radiation Oncology | Admitting: Radiation Oncology

## 2022-04-03 DIAGNOSIS — Z510 Encounter for antineoplastic radiation therapy: Secondary | ICD-10-CM | POA: Diagnosis not present

## 2022-04-03 LAB — RAD ONC ARIA SESSION SUMMARY
Course Elapsed Days: 30
Plan Fractions Treated to Date: 4
Plan Prescribed Dose Per Fraction: 2 Gy
Plan Total Fractions Prescribed: 5
Plan Total Prescribed Dose: 10 Gy
Reference Point Dosage Given to Date: 8 Gy
Reference Point Session Dosage Given: 2 Gy
Session Number: 20

## 2022-04-04 ENCOUNTER — Encounter: Payer: Self-pay | Admitting: *Deleted

## 2022-04-04 ENCOUNTER — Other Ambulatory Visit: Payer: Self-pay

## 2022-04-04 ENCOUNTER — Ambulatory Visit
Admission: RE | Admit: 2022-04-04 | Discharge: 2022-04-04 | Disposition: A | Discharge status: Home / Self Care | Payer: BC Managed Care – PPO | Source: Ambulatory Visit | Attending: Radiation Oncology | Admitting: Radiation Oncology

## 2022-04-04 DIAGNOSIS — Z510 Encounter for antineoplastic radiation therapy: Secondary | ICD-10-CM | POA: Diagnosis not present

## 2022-04-04 LAB — RAD ONC ARIA SESSION SUMMARY
Course Elapsed Days: 31
Plan Fractions Treated to Date: 5
Plan Prescribed Dose Per Fraction: 2 Gy
Plan Total Fractions Prescribed: 5
Plan Total Prescribed Dose: 10 Gy
Reference Point Dosage Given to Date: 10 Gy
Reference Point Session Dosage Given: 2 Gy
Session Number: 21

## 2022-04-28 ENCOUNTER — Other Ambulatory Visit: Payer: BC Managed Care – PPO

## 2022-05-06 ENCOUNTER — Encounter: Payer: Self-pay | Admitting: Internal Medicine

## 2022-05-06 ENCOUNTER — Inpatient Hospital Stay: Payer: BC Managed Care – PPO | Attending: Internal Medicine | Admitting: Internal Medicine

## 2022-05-06 ENCOUNTER — Telehealth: Payer: Self-pay | Admitting: Internal Medicine

## 2022-05-06 VITALS — BP 119/78 | HR 112 | Temp 97.9°F | Resp 19 | Wt 160.9 lb

## 2022-05-06 DIAGNOSIS — T451X5A Adverse effect of antineoplastic and immunosuppressive drugs, initial encounter: Secondary | ICD-10-CM | POA: Diagnosis not present

## 2022-05-06 DIAGNOSIS — E119 Type 2 diabetes mellitus without complications: Secondary | ICD-10-CM | POA: Insufficient documentation

## 2022-05-06 DIAGNOSIS — C50411 Malignant neoplasm of upper-outer quadrant of right female breast: Secondary | ICD-10-CM | POA: Insufficient documentation

## 2022-05-06 DIAGNOSIS — Z87891 Personal history of nicotine dependence: Secondary | ICD-10-CM | POA: Diagnosis not present

## 2022-05-06 DIAGNOSIS — Z170 Estrogen receptor positive status [ER+]: Secondary | ICD-10-CM | POA: Diagnosis not present

## 2022-05-06 DIAGNOSIS — Z79899 Other long term (current) drug therapy: Secondary | ICD-10-CM

## 2022-05-06 DIAGNOSIS — R232 Flushing: Secondary | ICD-10-CM | POA: Diagnosis not present

## 2022-05-06 DIAGNOSIS — E785 Hyperlipidemia, unspecified: Secondary | ICD-10-CM | POA: Insufficient documentation

## 2022-05-06 DIAGNOSIS — C50911 Malignant neoplasm of unspecified site of right female breast: Secondary | ICD-10-CM | POA: Diagnosis not present

## 2022-05-06 DIAGNOSIS — Z0189 Encounter for other specified special examinations: Secondary | ICD-10-CM | POA: Insufficient documentation

## 2022-05-06 DIAGNOSIS — Z79811 Long term (current) use of aromatase inhibitors: Secondary | ICD-10-CM | POA: Diagnosis not present

## 2022-05-06 NOTE — Progress Notes (Signed)
Central CONSULT NOTE  Patient Care Team: Sena Hitch Maximiano Coss as PCP - General (Physician Assistant) Daiva Huge, RN as Oncology Nurse Navigator  CANCER STAGING    Cancer Staging  Breast cancer, right [facility]) Staging form: Breast, AJCC 8th Edition - Clinical: Stage IA (cT1b, cN0, cM0, G1, ER+, PR+, HER2-) - Signed by Jane Canary, MD on 01/06/2022 Stage prefix: Initial diagnosis Nuclear grade: G1 Histologic grading system: 3 grade system   ASSESSMENT & PLAN:  Denise Saunders 62 y.o. 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  # Right breast Invasive tubular carcinoma, Stage 1A, ER/PR+, HER2- - detected on screening mammogram. s/p right breast lumpectomy with SLNB by Dr. Peyton Najjar on 01/17/2022. Surgical pathology showed invasive tubular carcinoma, DCIS low-grade, overall grade 1, size 9 mm, single focus of invasive carcinoma, LVI absent, margins negative, 0/2 lymph nodes involved.  pT1b N0  - Oncotype DX recurrence score 9. No benefit from chemotherapy. Completed RT to right breast on 04/03/22. Started Letrozole 2.5 mg daily on Feb 1st. Having hot flashes about 3 times per week mainly at night.  Denies affecting quality of life.  - DEXA scan is not scheduled. Will alert the staff.  Advised to start taking calcium vitamin D supplements -Annual screening mammogram due in September 2024.  # Hot flashes -Secondary to aromatase inhibitor use.  Occurring about 3 times a week. -Not affecting her quality of life.  Will continue to monitor at this time.  If it worsens can consider SNRI.  #Family history of cancer -Sister recently diagnosed with triple negative breast cancer and history of colon cancer.  Sister's genetic testing was negative.  Father had history of head cancer -Seen by genetics.    #HLD - on  lipitor  # Hx of goiter s/p thyroidectomy - on synthroid  #DM- on glipizide, insulin, metformin    No orders of the defined types were placed in this encounter.  RTC in 2 months for md visit, labs   The total time spent in the appointment was 30 minutes encounter with patients including review of chart and various tests results, discussions about plan of care and coordination of care plan   All questions were answered. The patient knows to call the clinic with any problems, questions or concerns. No barriers to learning was detected.  Jane Canary, MD 2/27/202412:33 PM   HISTORY OF PRESENTING ILLNESS:  Denise Saunders 62 y.o. female with pmh of diabetes, multinodular goiter status post thyroidectomy and GERD follows with medical oncology for stage IA right breast ER/PR + HER2 negative breast cancer. On Letrozole.   Interval history- Patient seen today as follow up for letrozole toxicity check  Patient has been on letrozole for about 4 weeks.  The past 1 to 1-1/2 weeks she has noticed hot flashes about 3 times a week mainly at night.  She also has acne eruption on her face.  Denies affecting her quality of life.   I have reviewed her chart and materials related to her cancer extensively and collaborated history with the patient. Summary of oncologic history is as follows: Oncology History  Breast cancer, right ([place])  11/28/2021 Mammogram   Screening mammogram FINDINGS: In the right breast possible masses in the outer breast require further evaluation.   In the left breast possible asymmetry in the upper breast in the oblique  projection requires further evaluation   12/17/2021 Mammogram   Diagnostic mammogram and US IMPRESSION: 1. Suspicious spiculated 0.8 cm mass containing calcifications in the right breast at 9:30 4-5 cm from nipple.   2. Indeterminate 0.6 cm mass in the right breast at 9 o'clock 4 cm from nipple, possibly an intramammary lymph node.   4.  Indeterminate 0.5 cm mass in the right breast at 9 o'clock 1 cm from the nipple.  [place] of bilateral axilla negative   12/31/2021 Pathology Results   DIAGNOSIS:  A. BREAST, RIGHT AT 930 O'CLOCK, 4-5 CM FROM THE NIPPLE;  ULTRASOUND-GUIDED CORE NEEDLE BIOPSY:  - INVASIVE MAMMARY CARCINOMA, WITH TUBULAR FEATURES.   Size of invasive carcinoma: 6 mm in this sample  Histologic grade of invasive carcinoma: Grade 1                       Glandular/tubular differentiation score: 1                       Nuclear pleomorphism score: 1                       Mitotic rate score: 1                       Total score: 3  Ductal carcinoma in situ: Present, low-grade  Lymphovascular invasion: Not identified   B. BREAST, RIGHT AT 9:00, 4 CM FROM NIPPLE; ULTRASOUND-GUIDED CORE  NEEDLE BIOPSY:  - FRAGMENTS OF BENIGN INTRAMAMMARY LYMPH NODE.  - FOCAL BACKGROUND BENIGN MAMMARY PARENCHYMA.  - NO EVIDENCE OF MALIGNANCY.   ADDENDUM:  CASE SUMMARY: BREAST BIOMARKER TESTS  Estrogen Receptor (ER) Status: POSITIVE          Percentage of cells with nuclear positivity: Greater than 90%          Average intensity of staining: Strong   Progesterone Receptor (PgR) Status: POSITIVE          Percentage of cells with nuclear positivity: Greater than 90%          Average intensity of staining: Strong   HER2 (by immunohistochemistry): NEGATIVE (Score 1+)  Ki-67: Not performed   under direct ultrasound visualization, needle aspiration of a mass at 9 o'clock 1 cm from the nipple was performed. Aspirated fluid has a benign appearance so was discarded.    01/17/2022 Definitive Surgery   Right breast lumpectomy with SLNB by Dr. Peyton Najjar  Surgical pathology showed invasive tubular carcinoma, DCIS low-grade, overall grade 1, size 9 mm, single focus of invasive carcinoma, LVI absent, margins negative, 0/2 lymph nodes involved.  pT1b N0    Menarche unsure Children 1 son Age at first birth 57 Birth control OCP more than 5  years Menopause age 19 Hysterectomy no HRT no  MEDICAL HISTORY:  Past Medical History:  Diagnosis Date   Breast cancer ([place])    right   Diabetes mellitus without complication ([place])    Family history of adverse reaction to anesthesia    MOM-N/V AND IS HARD TO WAKE   GERD (gastroesophageal reflux disease)    OCC   Hypothyroidism    Pneumonia 2016   Thyroid mass of unclear etiology     SURGICAL HISTORY: Past Surgical History:  Procedure Laterality Date   BREAST BIOPSY Right 12/31/2021   [place] Bx 9:30  4-5 cmfn , ribbon, Invasive mammary ca   BREAST BIOPSY Right 12/31/2021   [place]  Bx 9:00 4cmfn, Heart Clip,  FRAGMENTS OF BENIGN INTRAMAMMARY LYMPH NODE. -   BREAST BIOPSY Right 01/14/2022   MM RT RADIO FREQUENCY TAG LOC MAMMO GUIDE 01/14/2022 [facility]   CATARACT EXTRACTION, BILATERAL     CESAREAN SECTION     COLONOSCOPY     PART MASTECTOMY,RADIO FREQUENCY LOCALIZER,AXILLARY SENTINEL NODE BIOPSY Right 01/17/2022   Procedure: PART MASTECTOMY,RADIO FREQUENCY LOCALIZER,AXILLARY SENTINEL NODE BIOPSY;  Surgeon: Herbert Pun, MD;  Location: [facility] ORS;  Service: General;  Laterality: Right;   THYROIDECTOMY N/A 09/22/2016   Procedure: THYROIDECTOMY;  Surgeon: Margaretha Sheffield, MD;  Location: [facility] ORS;  Service: ENT;  Laterality: N/A;   UMBILICAL HERNIA REPAIR N/A 01/17/2022   Procedure: HERNIA REPAIR UMBILICAL ADULT;  Surgeon: Herbert Pun, MD;  Location: [facility] OR[facility];  Service: General;  Laterality: N/A;    SOCIAL HISTORY: Social History   Socioeconomic History   Marital status: Married    Spouse name: Not on file   Number of children: Not on file   Years of education: Not on file   Highest education level: Not on file  Occupational History   Not on file  Tobacco Use   Smoking status: Former    Packs/day: 0.25    Years: 15.00    Total pack years: 3.75    Types: Cigarettes    Quit date: 2022    Years since quitting: 2.1   Smokeless tobacco: Never  Vaping Use    Vaping Use: Never used  Substance and Sexual Activity   Alcohol use: No   Drug use: No   Sexual activity: Not Currently    Birth control/protection: None  Other Topics Concern   Not on file  Social History Narrative   Not on file   Social Determinants of Health   Financial Resource Strain: Not on file  Food Insecurity: Not on file  Transportation Needs: Not on file  Physical Activity: Not on file  Stress: Not on file  Social Connections: Not on file  Intimate Partner Violence: Not on file    FAMILY HISTORY: Family History  Problem Relation Age of Onset   Breast cancer Sister 34       genetic testing negative   Colon cancer Sister 79   Cancer Maternal Grandmother        unk type, metastatic   Cancer Paternal Grandfather        laryngeal   Breast cancer Cousin 58       genetic testing negative    ALLERGIES:  is allergic to codeine, dulaglutide, and liraglutide.  MEDICATIONS:  Current Outpatient Medications  Medication Sig Dispense Refill   atorvastatin (LIPITOR) 40 MG tablet Take 40 mg by mouth every evening.     busPIRone (BUSPAR) 15 MG tablet Take 15 mg by mouth 2 (two) times daily.     Continuous Blood Gluc Sensor (FREESTYLE LIBRE 14 DAY SENSOR) MISC SMARTSIG:1 Kit(s) Topical As Directed     gabapentin (NEURONTIN) 300 MG capsule Take 300 mg by mouth 2 (two) times daily. LUNCH AND bEDTIME     glipiZIDE (GLUCOTROL) 10 MG tablet Take 10 mg by mouth every evening.     letrozole (FEMARA) 2.5 MG tablet Take 1 tablet (2.5 mg total) by mouth daily. 30 tablet 2   levothyroxine (SYNTHROID) 88 MCG tablet Take 88 mcg by mouth daily before breakfast.     metFORMIN (GLUCOPHAGE-XR) 500 MG 24 hr tablet Take 1,000 mg by mouth every evening.     NOVOLIN 70/30 KWIKPEN (70-30) 100 UNIT/ML KwikPen  Inject 35-45 Units into the skin 2 (two) times daily. 35 units before breakfast and 45 units before dinner     traZODone (DESYREL) 100 MG tablet Take 100 mg by mouth at bedtime as needed.      venlafaxine XR (EFFEXOR-XR) 150 MG 24 hr capsule Take 150 mg by mouth at bedtime.     No current facility-administered medications for this visit.    REVIEW OF SYSTEMS:   Pertinent information mentioned in HPI All other systems were reviewed with the patient and are negative.  PHYSICAL EXAMINATION: ECOG PERFORMANCE STATUS: 0 - Asymptomatic  There were no vitals filed for this visit.   There were no vitals filed for this visit.   GENERAL:alert, no distress and comfortable SKIN: skin color, texture, turgor are normal, no rashes or significant lesions EYES: normal, conjunctiva are pink and non-injected, sclera clear OROPHARYNX:no exudate, no erythema and lips, buccal mucosa, and tongue normal  NECK: supple, thyroid normal size, non-tender, without nodularity LYMPH:  no palpable lymphadenopathy in the cervical, axillary or inguinal LUNGS: clear to auscultation and percussion with normal breathing effort HEART: regular rate & rhythm and no murmurs and no lower extremity edema ABDOMEN:abdomen soft, non-tender and normal bowel sounds Musculoskeletal:no cyanosis of digits and no clubbing  PSYCH: alert & oriented x 3 with fluent speech NEURO: no focal motor/sensory deficits  LABORATORY DATA:  I have reviewed the data as listed Lab Results  Component Value Date   WBC 7.8 03/28/2022   HGB 14.6 03/28/2022   HCT 43.5 03/28/2022   MCV 92.0 03/28/2022   PLT 237 03/28/2022   No results for input(s): "NA", "K", "CL", "CO2", "GLUCOSE", "BUN", "CREATININE", "CALCIUM", "GFRNONAA", "GFRAA", "PROT", "ALBUMIN", "AST", "ALT", "ALKPHOS", "BILITOT", "BILIDIR", "IBILI" in the last 8760 hours.  RADIOGRAPHIC STUDIES: I have personally reviewed the radiological images as listed and agreed with the findings in the report. No results found.

## 2022-05-06 NOTE — Telephone Encounter (Signed)
Per last LOS patient to be scheduled for DEXA that was ordered but not scheduled at prior appointment- DEXA scheduled for DEXA on 2/19 but no showed to that appointment. New Dexa scheduled and patient confirmed appointment

## 2022-05-07 ENCOUNTER — Ambulatory Visit: Payer: BC Managed Care – PPO | Admitting: Radiation Oncology

## 2022-05-14 ENCOUNTER — Ambulatory Visit
Admission: RE | Admit: 2022-05-14 | Discharge: 2022-05-14 | Disposition: A | Discharge status: Home / Self Care | Payer: BC Managed Care – PPO | Source: Ambulatory Visit | Attending: Internal Medicine | Admitting: Internal Medicine

## 2022-05-14 DIAGNOSIS — Z5181 Encounter for therapeutic drug level monitoring: Secondary | ICD-10-CM | POA: Insufficient documentation

## 2022-05-14 DIAGNOSIS — C50911 Malignant neoplasm of unspecified site of right female breast: Secondary | ICD-10-CM | POA: Diagnosis not present

## 2022-05-14 DIAGNOSIS — Z79811 Long term (current) use of aromatase inhibitors: Secondary | ICD-10-CM | POA: Insufficient documentation

## 2022-05-14 DIAGNOSIS — Z170 Estrogen receptor positive status [ER+]: Secondary | ICD-10-CM | POA: Insufficient documentation

## 2022-05-22 ENCOUNTER — Ambulatory Visit: Payer: BC Managed Care – PPO | Attending: Radiation Oncology | Admitting: Radiation Oncology

## 2022-05-22 DIAGNOSIS — C50811 Malignant neoplasm of overlapping sites of right female breast: Secondary | ICD-10-CM | POA: Insufficient documentation

## 2022-05-22 DIAGNOSIS — Z170 Estrogen receptor positive status [ER+]: Secondary | ICD-10-CM | POA: Insufficient documentation

## 2022-05-22 DIAGNOSIS — Z510 Encounter for antineoplastic radiation therapy: Secondary | ICD-10-CM | POA: Insufficient documentation

## 2022-05-22 DIAGNOSIS — Z87891 Personal history of nicotine dependence: Secondary | ICD-10-CM | POA: Insufficient documentation

## 2022-05-29 ENCOUNTER — Other Ambulatory Visit: Payer: Self-pay | Admitting: Internal Medicine

## 2022-06-03 ENCOUNTER — Inpatient Hospital Stay: Payer: BC Managed Care – PPO | Admitting: Internal Medicine

## 2022-06-03 ENCOUNTER — Inpatient Hospital Stay: Payer: BC Managed Care – PPO | Attending: Internal Medicine

## 2022-07-04 ENCOUNTER — Ambulatory Visit: Payer: BC Managed Care – PPO | Admitting: Internal Medicine

## 2022-07-04 ENCOUNTER — Other Ambulatory Visit: Payer: BC Managed Care – PPO

## 2022-07-07 ENCOUNTER — Other Ambulatory Visit: Payer: BC Managed Care – PPO

## 2022-07-07 ENCOUNTER — Ambulatory Visit: Payer: BC Managed Care – PPO | Admitting: Internal Medicine

## 2022-09-18 ENCOUNTER — Other Ambulatory Visit: Payer: Self-pay | Admitting: Internal Medicine

## 2022-10-02 ENCOUNTER — Other Ambulatory Visit: Payer: Self-pay | Admitting: General Surgery

## 2022-10-02 DIAGNOSIS — Z853 Personal history of malignant neoplasm of breast: Secondary | ICD-10-CM

## 2022-12-03 ENCOUNTER — Ambulatory Visit
Admission: RE | Admit: 2022-12-03 | Discharge: 2022-12-03 | Disposition: A | Discharge status: Home / Self Care | Payer: BC Managed Care – PPO | Source: Ambulatory Visit | Attending: General Surgery | Admitting: General Surgery

## 2022-12-03 DIAGNOSIS — Z853 Personal history of malignant neoplasm of breast: Secondary | ICD-10-CM | POA: Diagnosis present

## 2022-12-03 HISTORY — DX: Personal history of irradiation: Z92.3

## 2023-10-06 ENCOUNTER — Other Ambulatory Visit: Payer: Self-pay | Admitting: General Surgery

## 2023-10-06 DIAGNOSIS — Z853 Personal history of malignant neoplasm of breast: Secondary | ICD-10-CM

## 2023-12-09 ENCOUNTER — Encounter

## 2023-12-21 ENCOUNTER — Ambulatory Visit
Admission: RE | Admit: 2023-12-21 | Discharge: 2023-12-21 | Disposition: A | Discharge status: Home / Self Care | Source: Ambulatory Visit | Attending: General Surgery | Admitting: General Surgery

## 2023-12-21 DIAGNOSIS — Z853 Personal history of malignant neoplasm of breast: Secondary | ICD-10-CM | POA: Diagnosis present
